# Patient Record
Sex: Female | Born: 1935 | Race: White | Hispanic: No
Health system: Midwestern US, Community
[De-identification: ages and names within clinical notes are randomized; demographics above are authoritative.]

## PROBLEM LIST (undated history)

## (undated) DIAGNOSIS — M810 Age-related osteoporosis without current pathological fracture: Secondary | ICD-10-CM

## (undated) DIAGNOSIS — IMO0002 Reserved for concepts with insufficient information to code with codable children: Secondary | ICD-10-CM

## (undated) DIAGNOSIS — F32A Depression, unspecified: Secondary | ICD-10-CM

## (undated) DIAGNOSIS — F329 Major depressive disorder, single episode, unspecified: Secondary | ICD-10-CM

## (undated) HISTORY — DX: Major depressive disorder, single episode, unspecified: F32.9

## (undated) HISTORY — PX: VAGINAL HYSTERECTOMY: SHX2639

## (undated) HISTORY — DX: Reserved for concepts with insufficient information to code with codable children: IMO0002

## (undated) HISTORY — DX: Age-related osteoporosis without current pathological fracture: M81.0

## (undated) HISTORY — DX: Depression, unspecified: F32.A

## (undated) HISTORY — PX: TONSILLECTOMY: SUR1361

---

## 2007-09-24 ENCOUNTER — Ambulatory Visit: Payer: Self-pay | Admitting: Internal Medicine

## 2008-03-31 ENCOUNTER — Encounter: Payer: Self-pay | Admitting: Family Medicine

## 2008-04-08 ENCOUNTER — Encounter: Payer: Self-pay | Admitting: Family Medicine

## 2008-04-24 ENCOUNTER — Emergency Department: Payer: Self-pay | Admitting: Emergency Medicine

## 2008-05-09 ENCOUNTER — Encounter: Payer: Self-pay | Admitting: Family Medicine

## 2009-03-24 ENCOUNTER — Ambulatory Visit: Payer: Self-pay | Admitting: Internal Medicine

## 2010-06-19 ENCOUNTER — Ambulatory Visit: Payer: Self-pay | Admitting: Internal Medicine

## 2010-10-09 ENCOUNTER — Ambulatory Visit: Payer: Self-pay | Admitting: Internal Medicine

## 2011-03-15 ENCOUNTER — Observation Stay: Payer: Self-pay | Admitting: Internal Medicine

## 2011-03-23 ENCOUNTER — Ambulatory Visit: Payer: Self-pay | Admitting: Internal Medicine

## 2011-05-01 ENCOUNTER — Other Ambulatory Visit: Payer: Self-pay | Admitting: Internal Medicine

## 2011-05-07 NOTE — Telephone Encounter (Signed)
Opened in error

## 2011-05-08 ENCOUNTER — Ambulatory Visit: Payer: Self-pay | Admitting: Internal Medicine

## 2011-07-26 ENCOUNTER — Ambulatory Visit: Payer: Self-pay | Admitting: Internal Medicine

## 2011-10-17 ENCOUNTER — Other Ambulatory Visit: Payer: Self-pay | Admitting: Gastroenterology

## 2011-12-31 ENCOUNTER — Ambulatory Visit: Payer: Self-pay | Admitting: Gastroenterology

## 2012-01-02 LAB — PATHOLOGY REPORT

## 2012-07-28 ENCOUNTER — Ambulatory Visit: Payer: Self-pay | Admitting: Internal Medicine

## 2012-11-07 ENCOUNTER — Ambulatory Visit: Payer: Self-pay | Admitting: Neurosurgery

## 2013-03-10 LAB — CBC
MCV: 94 fL (ref 80–100)
Platelet: 258 10*3/uL (ref 150–440)
RBC: 4.21 10*6/uL (ref 3.80–5.20)
RDW: 13.5 % (ref 11.5–14.5)

## 2013-03-10 LAB — MAGNESIUM: Magnesium: 2.1 mg/dL

## 2013-03-11 ENCOUNTER — Observation Stay: Payer: Self-pay | Admitting: Student

## 2013-03-11 LAB — URINALYSIS, COMPLETE
Bacteria: NONE SEEN
Blood: NEGATIVE
Glucose,UR: NEGATIVE mg/dL (ref 0–75)
Leukocyte Esterase: NEGATIVE
Nitrite: NEGATIVE
Ph: 6 (ref 4.5–8.0)
Protein: NEGATIVE
RBC,UR: NONE SEEN /HPF (ref 0–5)
Specific Gravity: 1.005 (ref 1.003–1.030)
WBC UR: 1 /HPF (ref 0–5)

## 2013-03-11 LAB — BASIC METABOLIC PANEL
Anion Gap: 6 — ABNORMAL LOW (ref 7–16)
BUN: 23 mg/dL — ABNORMAL HIGH (ref 7–18)
Chloride: 106 mmol/L (ref 98–107)
EGFR (African American): >60
EGFR (Non-African Amer.): >60
Glucose: 87 mg/dL (ref 65–99)
Osmolality: 279 (ref 275–301)
Sodium: 138 mmol/L (ref 136–145)

## 2013-03-11 LAB — TSH: Thyroid Stimulating Horm: 1.87 u[IU]/mL

## 2013-04-09 ENCOUNTER — Ambulatory Visit (INDEPENDENT_AMBULATORY_CARE_PROVIDER_SITE_OTHER): Payer: Medicare Other | Admitting: Podiatrist

## 2013-04-09 ENCOUNTER — Encounter: Payer: Self-pay | Admitting: Podiatrist

## 2013-04-09 VITALS — BP 126/47 | HR 65 | Resp 16 | Ht 62.0 in | Wt 194.2 lb

## 2013-04-09 DIAGNOSIS — B351 Tinea unguium: Secondary | ICD-10-CM | POA: Insufficient documentation

## 2013-04-09 NOTE — Progress Notes (Signed)
N TOENAIL TRIM  L BOTH FEET 1-5  D O C A T CANT TRIM SELF

## 2013-04-09 NOTE — Progress Notes (Signed)
MRN: [id number] Name: Samantha Reilly  Sex: female Age: 77 y.o. DOB: September 29, 1935  Provider: Marlowe Aschoff P  Allergies: Fosamax; Aleve; and Sulfa antibiotics   Chief Complaint  Patient presents with  . DEBRIDE TOENAILS    NEEDS TOENAILS TRIMMED B/L 1-5     HPI: Patient is 77 y.o. female who presents today for elongated, thickened toenails. 1-5 bilateral  Past Medical History  Diagnosis Date  . Osteoporosis   . Depression   . Bulging disc        Medication List       This list is accurate as of: 04/09/13 12:10 PM.  Always use your most recent med list.               ABILIFY 15 MG tablet  Generic drug:  ARIPiprazole  Take 7.5 mg by mouth daily.     calcium carbonate 200 MG capsule  Take 2 by mouth twice a day     Co Q 10 100 MG Caps  Take by mouth daily.     gabapentin 300 MG capsule  Commonly known as:  NEURONTIN  Take 300 mg by mouth at bedtime as needed.     lamoTRIgine 100 MG tablet  Commonly known as:  LAMICTAL  Take 200 mg by mouth daily.     levothyroxine 75 MCG tablet  Commonly known as:  SYNTHROID, LEVOTHROID  Take 75 mcg by mouth daily before breakfast.     NON FORMULARY  Flexacil Ultra 700 mg, take 3 tablets daily     OMEGA-3 FISH OIL PO  Take by mouth daily.     phenelzine 15 MG tablet  Commonly known as:  NARDIL  Take 15 mg by mouth 3 (three) times daily.     Potassium 99 MG Tabs  Take by mouth. Take one every am and 2 every pm     thiamine 100 MG tablet  Commonly known as:  VITAMIN B-1  Take 100 mg by mouth daily.     Vitamin D3 2000 UNITS Tabs  Take 2 capsules by mouth.        Meds ordered this encounter  Medications  . ARIPiprazole (ABILIFY) 15 MG tablet    Sig: Take 7.5 mg by mouth daily.  . phenelzine (NARDIL) 15 MG tablet    Sig: Take 15 mg by mouth 3 (three) times daily.  Marland Kitchen lamoTRIgine (LAMICTAL) 100 MG tablet    Sig: Take 200 mg by mouth daily.  Marland Kitchen levothyroxine (SYNTHROID, LEVOTHROID) 75 MCG tablet    Sig: Take  75 mcg by mouth daily before breakfast.  . DISCONTD: ibuprofen (ADVIL,MOTRIN) 200 MG tablet    Sig: Take 200 mg by mouth every 6 (six) hours as needed for pain.  Marland Kitchen gabapentin (NEURONTIN) 300 MG capsule    Sig: Take 300 mg by mouth at bedtime as needed.  . Potassium 99 MG TABS    Sig: Take by mouth. Take one every am and 2 every pm  . Cholecalciferol (VITAMIN D3) 2000 UNITS TABS    Sig: Take 2 capsules by mouth.  . Omega-3 Fatty Acids (OMEGA-3 FISH OIL PO)    Sig: Take by mouth daily.  . Coenzyme Q10 (CO Q 10) 100 MG CAPS    Sig: Take by mouth daily.  . calcium carbonate 200 MG capsule    Sig: Take 2 by mouth twice a day  . thiamine (VITAMIN B-1) 100 MG tablet    Sig: Take 100 mg by mouth daily.  Marland Kitchen NON  FORMULARY    Sig: Flexacil Ultra 700 mg, take 3 tablets daily    Past Surgical History  Procedure Laterality Date  . Vaginal hysterectomy    . Tonsillectomy       History  Substance Use Topics  . Smoking status: Never Smoker   . Smokeless tobacco: Never Used  . Alcohol Use: No    No family history on file.  Review of Systems  DATA OBTAINED: from patient GENERAL: Feels well no fevers, no fatigue, no changes in appetite SKIN: No itching, no rashes, no open lesions, no wounds EYES: No eye pain,no redness, no discharge EARS: No earache,no ringing of ears, no recent change in hearing NOSE: No congestion, no drainage, no bleeding  MOUTH/THROAT: No mouth pain, No sore throat, No difficulty chewing or swallowing  RESPIRATORY: No cough, no wheezing, no SOB CARDIAC: No chest pain,no heart palpitations,no new onset lower extremity edema  GI: No abdominal pain, No Nausea, no vomiting, no diarrhea, no heartburn or no reflux  GU: No dysuria, no increased frequency or urgency MUSCULOSKELETAL: No unrelieved bone/joint pain,  NEUROLOGIC: Awake, alert, appropriate to situation, No change in mental status. PSYCHIATRIC: No overt anxiety or sadness.No behavior issue.  AMBULATION:   Ambulates unassisted  Filed Vitals:   04/09/13 0942  BP: 126/47  Pulse: 65  Resp: 16    Physical Exam  GENERAL APPEARANCE: Alert, conversant. Appropriately groomed. No acute distress.  VASCULAR: Pedal pulses palpable and strong bilateral.  Capillary refill time is immediate to all digits,  Proximal to distal cooling it warm to warm.  Digital hair growth is present bilateral NEUROLOGIC: sensation is intact epicritically and protectively to 5.07 monofilament at 5/5 sites bilateral.  Light touch is intact bilateral, vibratory sensation intact bilateral, achilles tendon reflex is intact bilateral. MUSCULOSKELETAL: acceptable muscle strength, tone and stability bilateral.  Intrinsic muscluature intact bilateral.  Rectus appearance of foot and digits noted bilateral. DERMATOLOGIC: skin color, texture, and turger are within normal limits.  No preulcerative lesions are seen, no interdigital maceration noted.  No open lesions present.  Digital nails are elongated and thickened and asymptomatic. Patient relates nails are elongated but not painful   Patient Active Problem List   Diagnosis Date Noted  . Onychomycosis of toenail 04/09/2013    Assessment   Asymptomatic onychomycosis  Plan  No orders of the defined types were placed in this encounter.   Discussed treatment options and alternatives.  Counseled patient on nail care and discussed she is not eligible for covered service due to no pain and no class findings noted.  Patient demonstrates understanding of conversation and will be seen back per her request    Davy Pique MRN: [id number] Name: Samantha Reilly  Sex: female Age: 77 y.o. DOB: 10/16/1935

## 2013-04-09 NOTE — Patient Instructions (Signed)
Call if any problems or concerns arise in the future

## 2013-07-29 ENCOUNTER — Ambulatory Visit: Payer: Self-pay | Admitting: Internal Medicine

## 2013-08-21 IMAGING — CR DG KNEE COMPLETE 4+V*R*
1 series · 4 of 4 positions shown · non-contrast
Comparison: none

REASON FOR EXAM: knee pain, s/p fall
COMMENTS:

[Series 1: view not recorded · 0.17mm/px · 4 of 4 slices shown]
[im 1/4]
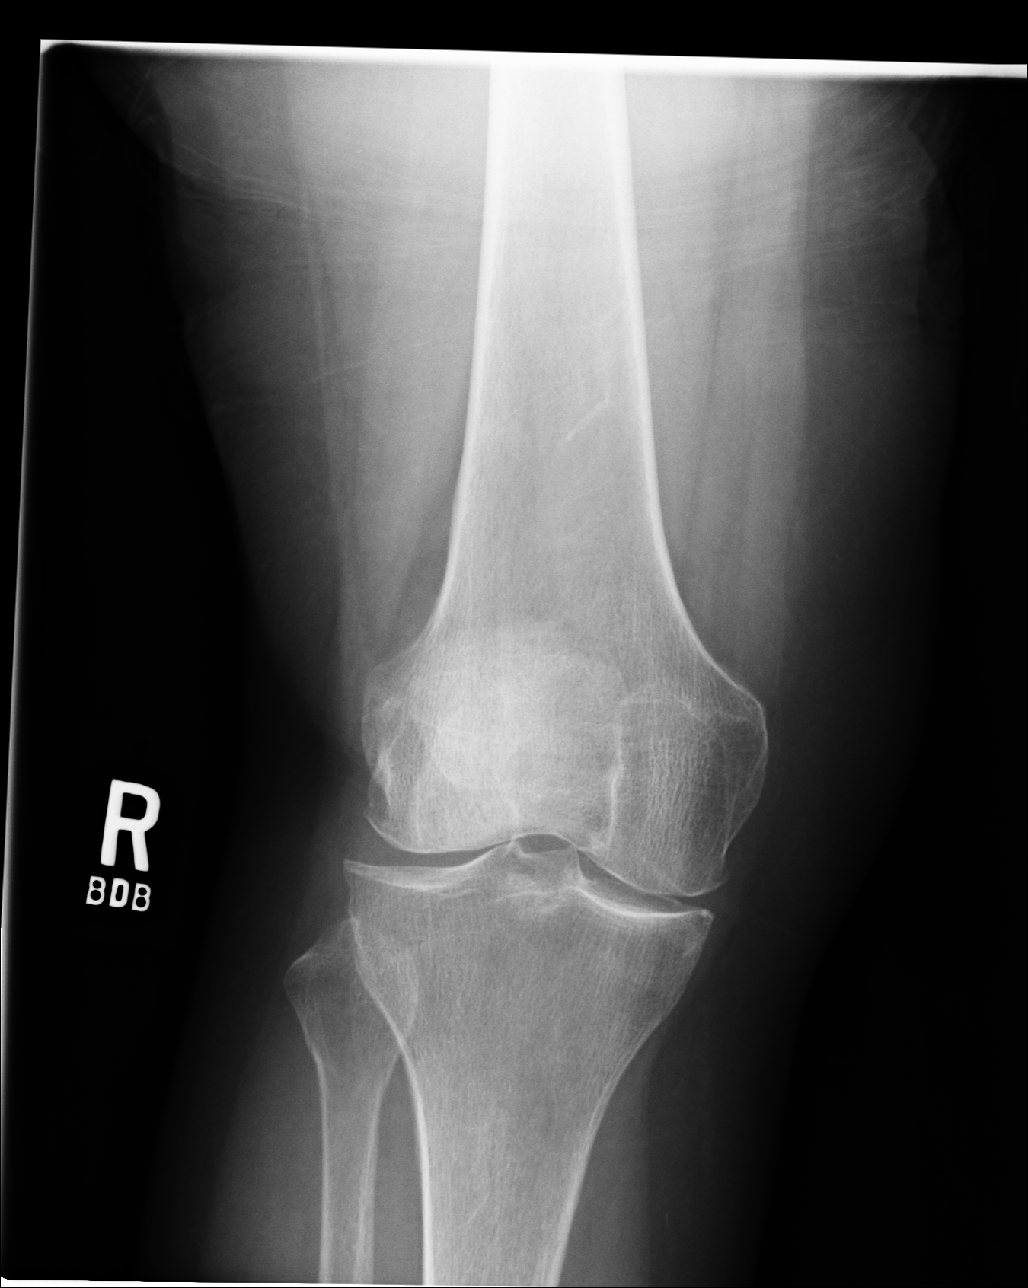
[im 2/4]
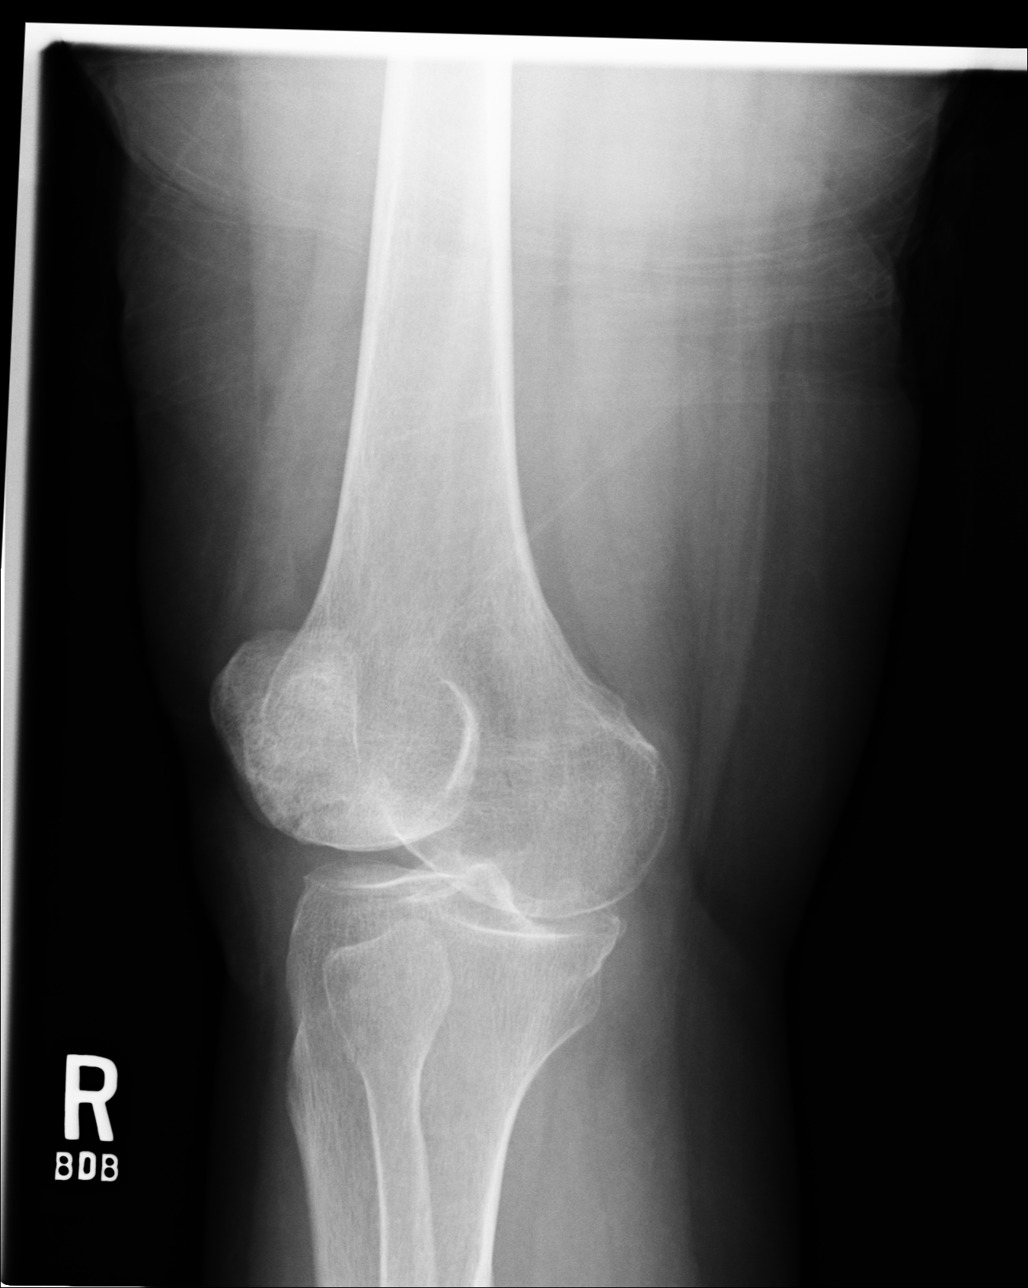
[im 3/4]
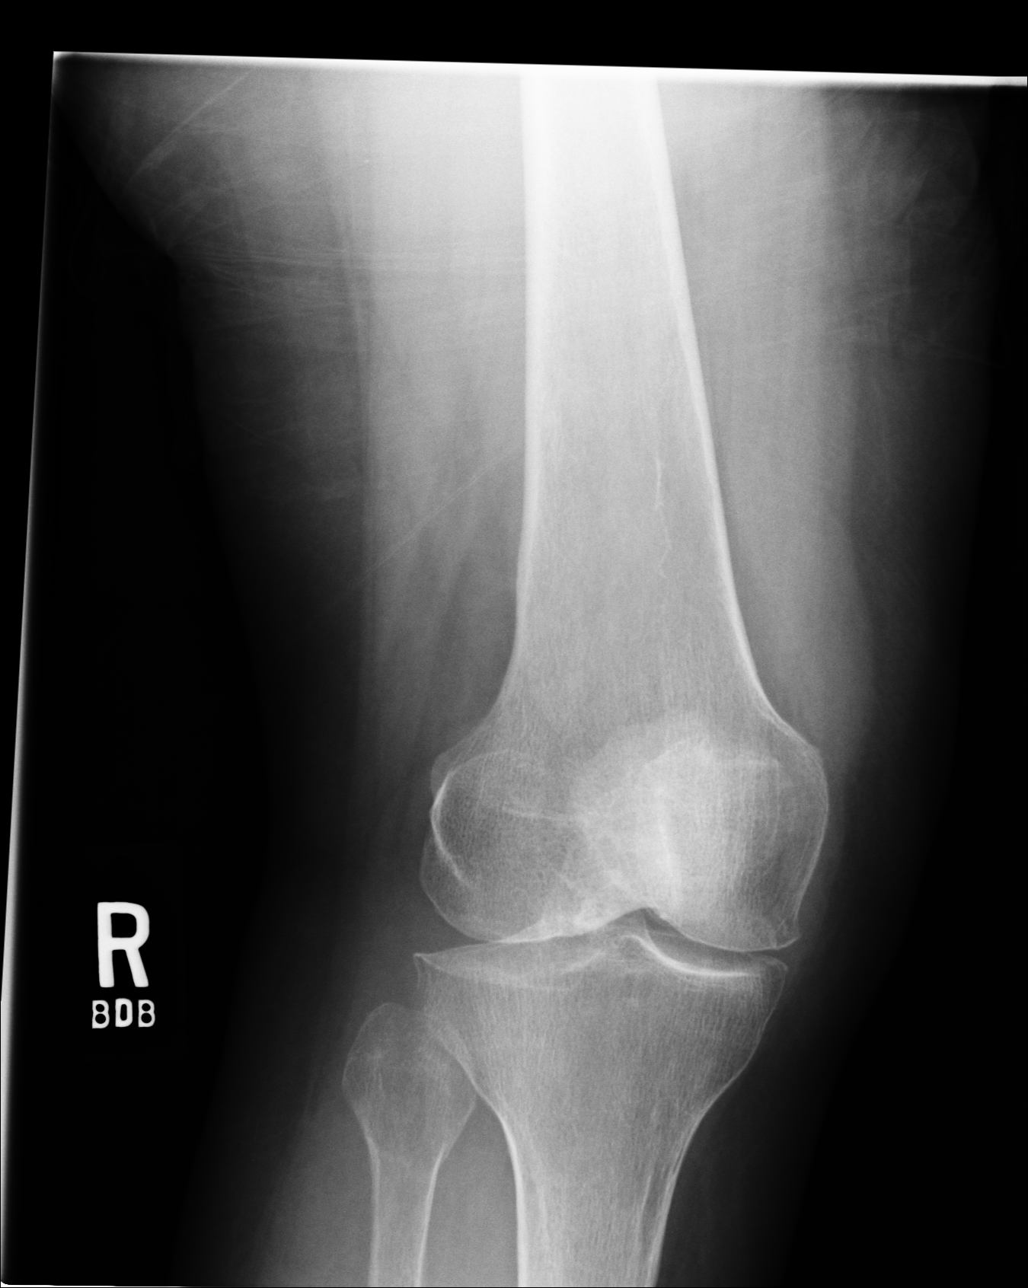
[im 4/4]
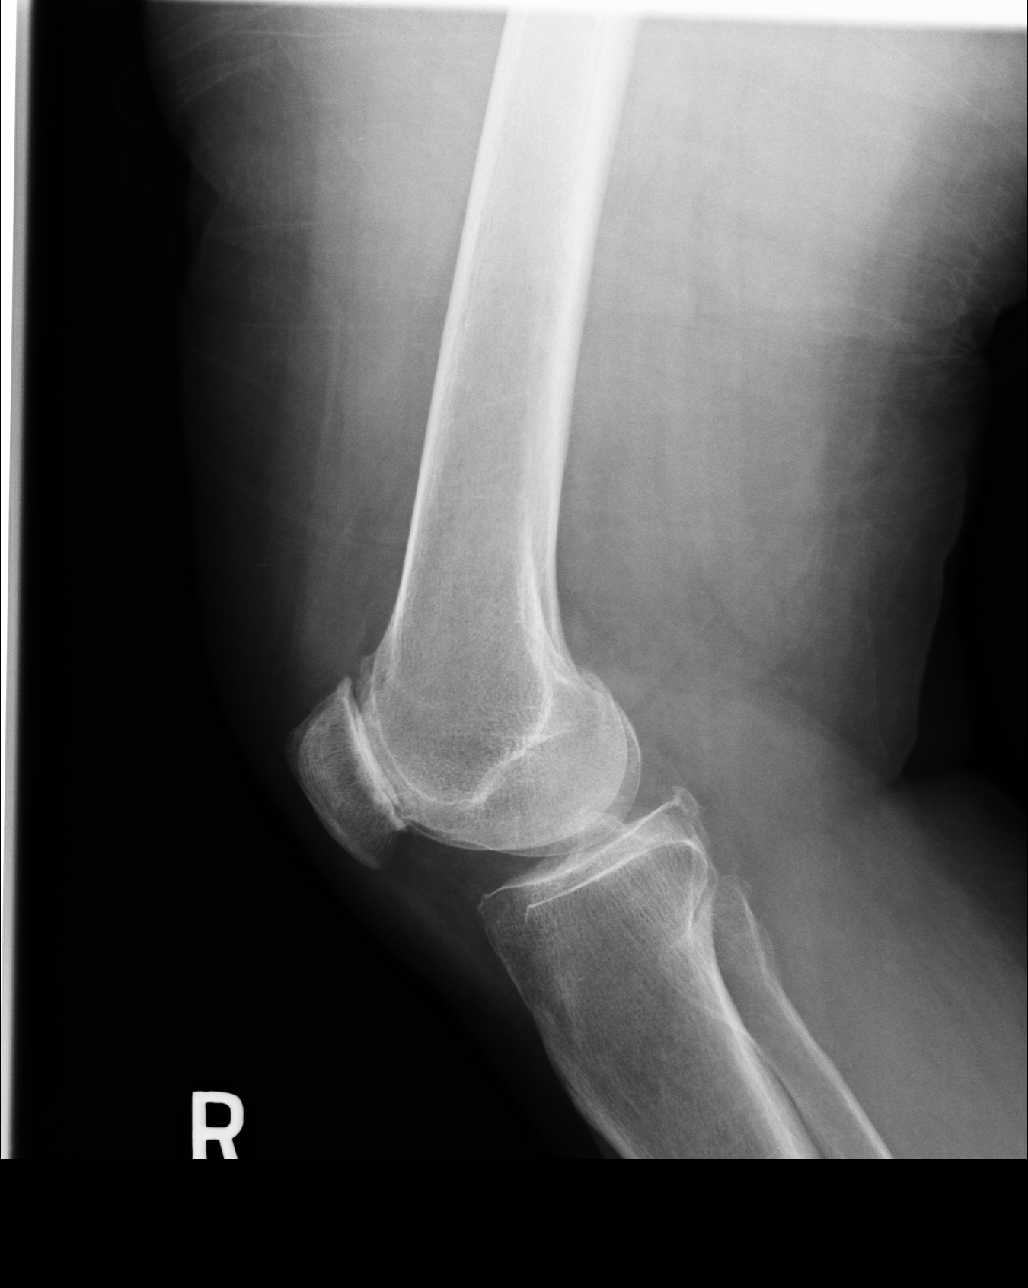

[4 of 4 positions shown; findings below may reference images not displayed]

PROCEDURE:     DXR - DXR KNEE RT COMP WITH OBLIQUES  - March 23, 2011  [DATE]

RESULT:     No fracture, dislocation or other acute bony abnormality is
identified. There is very slight spur formation at the knee medially and
laterally. Additionally, there is slight dorsal patella spurring and
narrowing of the femoral patella joint space compatible with arthritic
change. No lytic or blastic lesions about the knee are seen.
IMPRESSION: 1. No fracture or other acute bony abnormality is seen.
2. There is slight arthritic spurring about the knee.
3. Arthritic change is noted at the femoropatellar joint.

## 2013-12-21 ENCOUNTER — Ambulatory Visit: Payer: Self-pay | Admitting: Pain Medicine

## 2013-12-24 ENCOUNTER — Other Ambulatory Visit: Payer: Self-pay | Admitting: Pain Medicine

## 2013-12-24 LAB — HEPATIC FUNCTION PANEL A (ARMC)
ALBUMIN: 3 g/dL — AB (ref 3.4–5.0)
Alkaline Phosphatase: 121 U/L — ABNORMAL HIGH
Bilirubin, Direct: <0.1 mg/dL (ref 0.00–0.20)
Bilirubin,Total: 0.3 mg/dL (ref 0.2–1.0)
SGOT(AST): 12 U/L — ABNORMAL LOW (ref 15–37)
SGPT (ALT): 24 U/L (ref 12–78)
TOTAL PROTEIN: 6.2 g/dL — AB (ref 6.4–8.2)

## 2013-12-24 LAB — BASIC METABOLIC PANEL
ANION GAP: 6 — AB (ref 7–16)
BUN: 25 mg/dL — AB (ref 7–18)
CALCIUM: 8.8 mg/dL (ref 8.5–10.1)
CO2: 26 mmol/L (ref 21–32)
Chloride: 102 mmol/L (ref 98–107)
Creatinine: 1.18 mg/dL (ref 0.60–1.30)
EGFR (African American): 52 — ABNORMAL LOW
GFR CALC NON AF AMER: 45 — AB
GLUCOSE: 105 mg/dL — AB (ref 65–99)
OSMOLALITY: 273 (ref 275–301)
POTASSIUM: 4.4 mmol/L (ref 3.5–5.1)
SODIUM: 134 mmol/L — AB (ref 136–145)

## 2013-12-24 LAB — MAGNESIUM: Magnesium: 2.2 mg/dL

## 2013-12-24 LAB — SEDIMENTATION RATE: ERYTHROCYTE SED RATE: 31 mm/h — AB (ref 0–30)

## 2014-01-20 ENCOUNTER — Ambulatory Visit: Payer: Self-pay | Admitting: Pain Medicine

## 2014-01-21 ENCOUNTER — Ambulatory Visit: Payer: Self-pay | Admitting: Pain Medicine

## 2014-02-04 ENCOUNTER — Ambulatory Visit: Payer: Self-pay | Admitting: Internal Medicine

## 2014-02-09 ENCOUNTER — Ambulatory Visit: Payer: Self-pay | Admitting: Pain Medicine

## 2014-02-24 ENCOUNTER — Ambulatory Visit: Payer: Self-pay | Admitting: Pain Medicine

## 2014-03-03 ENCOUNTER — Ambulatory Visit: Payer: Self-pay | Admitting: Neurology

## 2014-03-30 ENCOUNTER — Ambulatory Visit: Payer: Self-pay | Admitting: Pain Medicine

## 2014-04-06 ENCOUNTER — Ambulatory Visit: Payer: Self-pay | Admitting: Pain Medicine

## 2014-04-21 ENCOUNTER — Ambulatory Visit: Payer: Self-pay | Admitting: Pain Medicine

## 2014-05-04 ENCOUNTER — Ambulatory Visit: Payer: Self-pay | Admitting: Pain Medicine

## 2014-05-18 ENCOUNTER — Ambulatory Visit: Payer: Self-pay | Admitting: Pain Medicine

## 2014-10-29 NOTE — H&P (Signed)
PATIENT NAME:  Samantha Reilly, Samantha Reilly MR#:  [id number] DATE OF BIRTH:  12/26/1935  DATE OF ADMISSION:  03/11/2013  REFERRING PHYSICIAN:  Brenton Grills, MD    PRIMARY CARE PHYSICIAN:  Gwenette Greet A. Heber [place] Weber, MD  CHIEF COMPLAINT:  Weakness, unsteady gait.   HISTORY OF PRESENT ILLNESS: This is a 79 year old female with significant past medical history of hypothyroidism, osteoporosis, depression, hypertension, who presents with complaints of difficulty walking and unsteady gait for the last 24 hours. She denies any focal deficits, any tingling, any numbness, any paresthesia. Patient had a CT head without contrast in ED, which did not show any acute changes, only stable findings from previous CT, which was showing atrophy and chronic microvascular ischemic disease. The patient had basic blood work-up done,  which came back significant for hypocalcemia at 5. As well, her last values were reviewed at [facility] They were 9.6 in April 2013. The patient's magnesium level was at 2.1. The patient denies any tetany, any muscle spasms, as well she denies any chest pain, shortness of breath, fever, chills, vomiting, nausea. Hospitalist service was requested to admit the patient for further management and work up of her hypocalcemia. The patient's EKG did not show any significant ST or T wave changes and no QT prolongation.   PAST MEDICAL HISTORY:  1.  Allergic rhinitis.  2.  Hypothyroidism.  3.  Osteoporosis.  4.  Osteoarthritis.  5.  Depression.  6.  History of hypertension.  7.  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   ALLERGIES: MORPHINE AND SULFA.   HOME MEDICATIONS:  1.  Gabapentin 300 mg oral at bedtime.  2.  Lamictal 100 mg oral daily.  3.  Phenelzine 15 mg oral 3 times a day.  4.  Abilify 5 mg oral 1-1/2 tablets daily.  5.  Potassium chloride 99 mg 1 tablet in the morning, 2 tablets at bedtime.  6.  Synthroid 75 mcg oral daily.  7.  Balanced B-100 oral tablet  daily.  8.  Vitamin D3 2000 international units 2 capsules daily.  9.  Calcium, magnesium and zinc tablet, 2 tablets 2 times a day.  10.  Omega fish oil 600/240 one capsule twice a day.  11.  CoQ10 100 mg plus vitamin E capsule 2 capsules daily.   SOCIAL HISTORY: Nonsmoker. No alcohol. No drug use. Lives at home with her husband.   FAMILY HISTORY: Significant for coronary artery disease in her brother.   REVIEW OF SYSTEMS:  CONSTITUTIONAL: The patient denies fever, chills. Complains of generalized weakness. Denies weight gain, weight loss.  EYES: Denies blurry vision, double vision, inflammation, glaucoma.  ENT: Denies tinnitus, ear pain, hearing loss, epistaxis or discharge.  RESPIRATORY: Denies cough, wheezing, hemoptysis, dyspnea or COPD. CARDIOVASCULAR: Denies chest pain, orthopnea, edema, arrhythmia or palpitations.  GASTROINTESTINAL: Denies nausea, vomiting, diarrhea, abdominal pain, hematemesis, melena, jaundice, rectal bleed.  GENITOURINARY: Denies dysuria, hematuria, renal colic.  ENDOCRINE: Denies polyuria, polydipsia, heat or cold intolerance. Has history of hypothyroidism. Denies any history of thyroid surgery or parathyroid surgery.  INTEGUMENTARY: Denies acne, rash or skin lesions.  MUSCULOSKELETAL: Denies any gout, cramps. Has history of arthritis. Denies any twitching of the muscles or muscle spasm.  NEUROLOGIC: Denies CVA, TIA, seizures, memory loss, dementia or focal weakness or numbness. Complains of unsteady gait and generalized weakness.  PSYCHIATRIC: Denies any anxiety, insomnia, substance abuse, alcohol abuse. Has history of depression in the past, which required ECT therapy.   PHYSICAL EXAMINATION:  VITAL SIGNS:  Temperature 98.1, pulse 53, respiratory rate 18, blood pressure 135/55, saturating 98% on room air.  GENERAL: Frail, elderly female who looks comfortable and in no apparent distress.  HEENT: Head is atraumatic, normocephalic. Pupils equal, reactive to light.  Pink conjunctivae. Anicteric sclerae. Moist oral mucosa.  NECK: Supple. No thyromegaly. No JVD.  CHEST: Good air entry bilaterally. No wheezing, rales or rhonchi.  CARDIOVASCULAR: S1, S2 heard. No rubs, murmur or gallops.  ABDOMEN: Soft, nontender, nondistended. Bowel sounds present.  EXTREMITIES: No edema. No clubbing. No cyanosis. Dorsalis pedis pulse +2 bilaterally.  SKIN: Normal skin turgor. Warm and dry.  PSYCHIATRIC: Appropriate affect. Awake, alert x3. Intact judgment and insight.  NEUROLOGIC: Cranial nerves grossly intact. Motor 5/5. No increased tonicity. No hyperreflexia. Negative Chvostek sign.   PERTINENT LABORATORIES AND DATA: Glucose 87, BUN 26, creatinine less than 0.1, sodium 135, potassium 4.5, chloride 106, CO2 20. Anion gap 9. Osmolality 274. Troponin is 0.02. ALT 30, AST 23, alk  phos 139, albumin serum 3.2, total protein 6.9, total bilirubin 0.2. Troponin less than 0.02. White blood cells 8, hemoglobin 13.2, hematocrit 39.7, platelets 257.   IMAGING: CT head without contrast showing stable CT of brain with changes of atrophy and chronic microvascular ischemic disease. No acute intracranial abnormality evident.   ASSESSMENT AND PLAN:  1.  Hypocalcemia. The patient's last value in April 2013 was 9.6. Appears to be significant drop within 1 year. The patient denies any history of neck surgery or any recent diagnosis of calcium disorder, but she reports she did cut her p.o. calcium supplement due to financial reasons over the last couple of months, but doubt this will cause this significant hypocalcemia. The patient was given calcium gluconate IV, a total of 2 grams in the ED. As well, given 2 grams of magnesium in the ED as well. Her level was normal, magnesium at 2.1. The patient does not appear to have any arrhythmias or any QT prolongation. We will check vitamin D, PTH, PTH-related peptide and TSH. I will consult endocrinology service. We will continue to monitor her on tele. We  will recheck another set of labs and, if her hypocalcemia is not improved significantly with the supplements in the ED, she will need to be started on calcium drip. Meanwhile, we will start her on p.o. calcium as well.  2.  Generalized weakness and unsteady gait. The patient's CT did not show an acute findings. Her physical exam has no focal deficits. Has no acute findings. This is most likely related to  hypocalcemia. We will have PT consulted.  3.  Hypothyroidism. We will check TSH, continue with Synthroid.  4.  Osteoporosis. Continue with calcium and vitamin D.  5.  Depression. Continue with her home meds.  6.  Hypertension. Blood pressure is acceptable. Continue her on hydrochlorothiazide.  7.  Deep vein thrombosis prophylaxis. Subcu heparin.  8.  CODE STATUS: The patient is FULL CODE. Reports she has a LIVING WILL and her husband is her healthcare power of attorney.   TOTAL TIME SPENT ON ADMISSION AND PATIENT CARE: 55 minutes.  ____________________________ Albertine Patricia, MD dse:sw D: 03/11/2013 01:19:00 ET T: 03/11/2013 02:04:51 ET JOB#: [id number]  cc: Albertine Patricia, MD, <Dictator> DAWOOD Graciela Husbands MD ELECTRONICALLY SIGNED 03/20/2013 1:08

## 2014-10-29 NOTE — Discharge Summary (Signed)
PATIENT NAME:  Samantha Reilly MR#:  [id number] DATE OF BIRTH:  1936/05/10  DATE OF ADMISSION:  03/11/2013 DATE OF DISCHARGE:  03/11/2013  PRIMARY CARE PHYSICIAN: Samantha P. Alison MurrayAndreassi, MD  CHIEF COMPLAINT: Weakness, unsteady gait.   DISCHARGE DIAGNOSES 1.  Weakness and dizziness, resolved, possibly chronic.  2.  Degenerative disk disease and scoliosis.  3.  History of osteoporosis.  4.  Hypothyroidism.  5.  Depression.  6.  Hypertension.   DISCHARGE MEDICATIONS 1.  Synthroid 75 mcg daily.  2.  Phenelzine 15 mg 3 times a day.  3.  Lamotrigine 200 mg once a day.  4.  Abilify 7.5 mg once a day.  5.  Balanced B 100 oral tab, 1 tab daily.  6.  Vitamin D3 2000 international units 2 capsules once a day. 7.  Potassium chloride  oral tablet, 1 tab once a day in the morning and 2 tabs bedtime.  8.  Co-Q enzyme 10, 100 mg 2 caps once a day.  9.  Omega fish oil one 1 one cap once a day.  10.  Gabapentin 300 mg at bedtime as needed.   DIET: Low sodium.   ACTIVITY: As tolerated. Outpatient PT referral.   FOLLOWUP: Please follow with your primary care physician. She will be getting discharged with a walker.   SIGNIFICANT LABS AND IMAGING 1.  Initial BUN 23, creatinine 0.86, sodium 138, potassium 4.5, chloride was 106. Initial sets of labs was noted to be error and labs which I just dictated were corrected.  2.  Calcium initially was 10.3, magnesium was 2.1. TSH was 1.87. CBC within normal limits. Urinalysis not suggestive of infection.  3.  CAT scan of the head without contrast showing stable CT of the brain with changes of atrophy and chronic microvascular ischemic changes.   HOSPITAL COURSE: For full details of H and P, please see the dictation by Dr. Randol KernElgergawy from earlier today but, briefly, this is a 79 year old female with hypothyroidism, osteoporosis, depression, hypertension, who came in for difficulty walking and unsteady gait. She was noted to have initially with a low calcium in  the 5 range and admitted to the hospitalist service. However, the first set of labs was all erroneous and the labs were redrawn and the calcium came back at 10 after the patient did receive some IV calcium. She underwent a CAT scan of the head which was negative for acute stroke and she was neurologically intact. Although initially she was admitted for hypocalcemia, I believe the dizziness and unsteady gait is more chronic in nature as her husband described walking slowly with a hunched-over neck and has degenerative disk disease, osteoporosis and he stated that he had felt that the patient needed a walker for a long time. She was seen by PT and even prior to her being seen by PT I got paged multiple times as the patient and her husband wanted to go home. I discussed the case with physical therapist who stated that she did well with PT and recommendation was outpatient PT and a walker. I have put a prescription for a walker. As the patient and her husband are very adamant about getting discharged, I will go ahead and discharge her as there is no neurological findings.   PHYSICAL EXAMINATION VITAL SIGNS:  On the day of discharge, her temperature is 98.1, pulse rate 66, respiratory rate  18, blood pressure 106/55, O2 sat 92% on room air.  GENERAL: The patient is an obese female laying in bed  in no obvious distress.  HEENT: Normocephalic, atraumatic. Moist mucous membranes.  NECK: Supple.  LUNGS: Clear to auscultation without wheezing, rhonchi or rales.  HEART:  Auscultation of the heart sounds shows a regular rate without significant murmurs.  ABDOMEN: Morbidly obese. No tenderness.  EXTREMITIES: No significant lower extremity edema.   At this point, she will be getting discharged.   TOTAL TIME SPENT: 35 minutes.   Of note, the patient states that her PCP has moved and she has established care with [facility] but her appointment is in October. I did state that if she has any other symptoms or  worsening of her symptoms or any other issues, to go to the [facility] walk-in urgent care center and if her symptoms persist or come back she can also come to the Emergency Room, but given how adamant she is about going home, her husband stated that she would likely go to the urgent care center.   ____________________________ Krystal Eaton, MD sa:cs D: 03/11/2013 16:59:00 ET T: 03/11/2013 20:35:00 ET JOB#: [id number]  cc: Krystal Eaton, MD, <Dictator> Krystal Eaton MD ELECTRONICALLY SIGNED 03/31/2013 13:51

## 2016-08-01 IMAGING — MR MRI HEAD WITHOUT AND WITH CONTRAST
12 series · 42 of 48 positions shown · IV contrast (multihance)
Comparison: 03/10/2013

CLINICAL DATA: Followup atrophy.  Memory loss.  Recent fall.

EXAM:
MRI HEAD WITHOUT AND WITH CONTRAST
TECHNIQUE: Multiplanar, multiecho pulse sequences of the brain and surrounding
structures were obtained without and with intravenous contrast.
CONTRAST:  9 cc MultiHance

[Series 2: T1 · sagittal · 5.0mm · 0.45mm/px · 3 of 27 slices shown (1 of 2)]
[im 1/27]
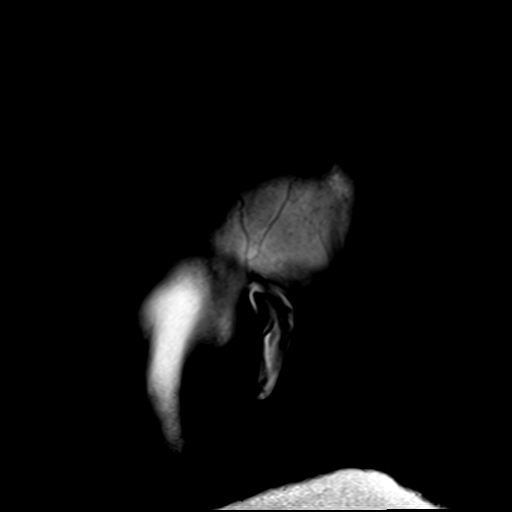
[im 14/27]
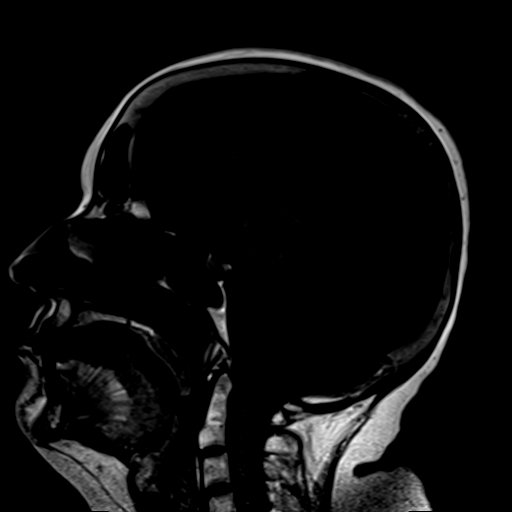
[im 27/27]
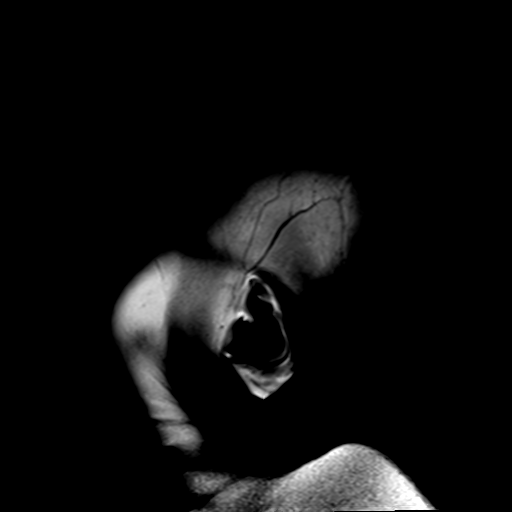

[Series 4: DWI · axial · 5.0mm · 1.80mm/px · z∈[-87,+70]mm · 3 of 26 slices shown (1 of 4)]
[im 1/26]
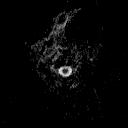
[im 13/26]
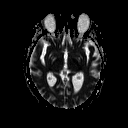
[im 26/26]
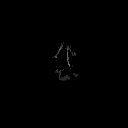

[Series 6: DWI · coronal · 5.0mm · 1.80mm/px · 4 of 37 slices shown (2 of 4)]
[im 1/37]
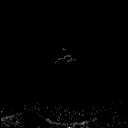
[im 13/37]
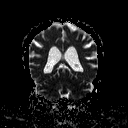
[im 25/37]
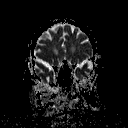
[im 37/37]
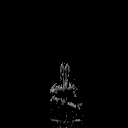

[Series 7: T2 · axial · 5.0mm · 0.60mm/px · z∈[-87,+76]mm · 3 of 27 slices shown (1 of 2)]
[im 1/27]
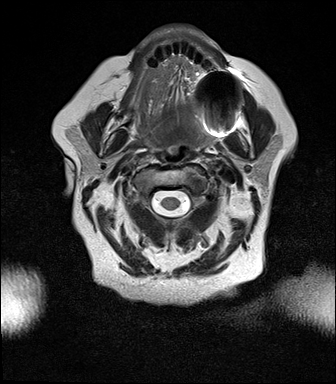
[im 14/27]
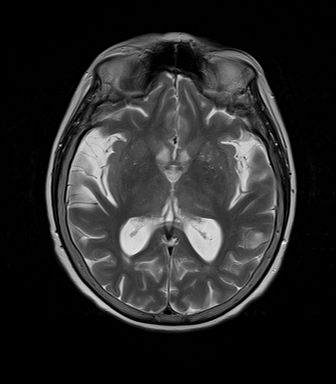
[im 27/27]
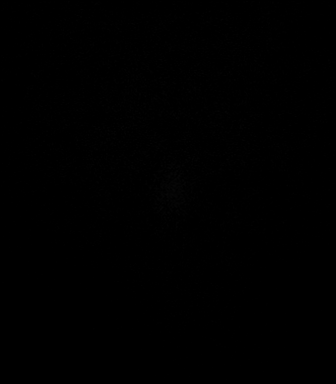

[Series 8: FLAIR · axial · 5.0mm · 0.45mm/px · z∈[-87,+76]mm · 3 of 27 slices shown]
[im 1/27]
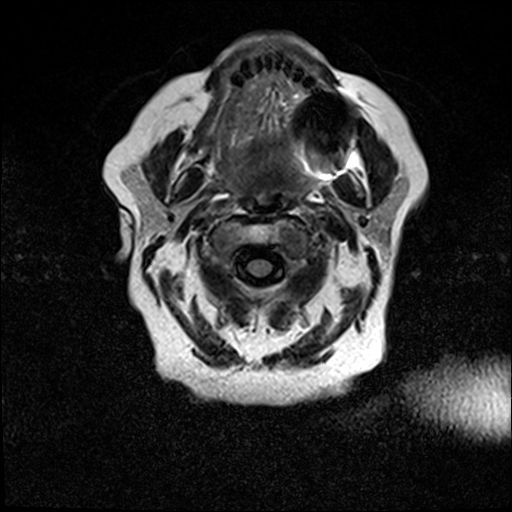
[im 14/27]
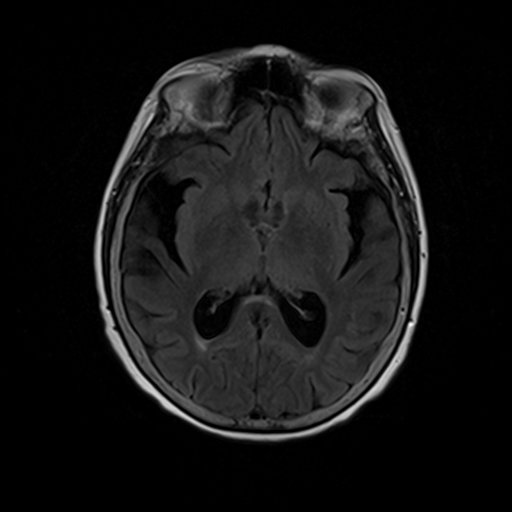
[im 27/27]
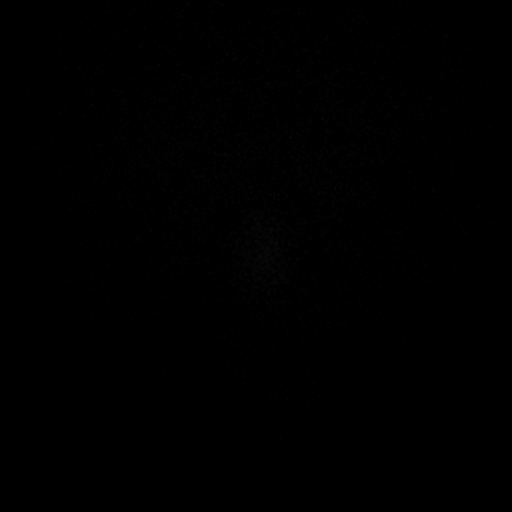

[Series 9: T2 · axial · 5.0mm · 0.45mm/px · z∈[-87,+76]mm · 3 of 27 slices shown (2 of 2)]
[im 1/27]
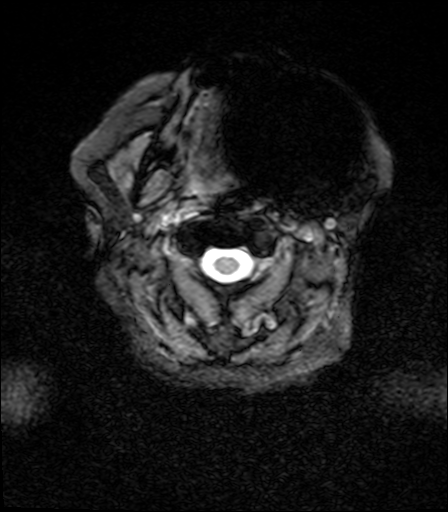
[im 14/27]
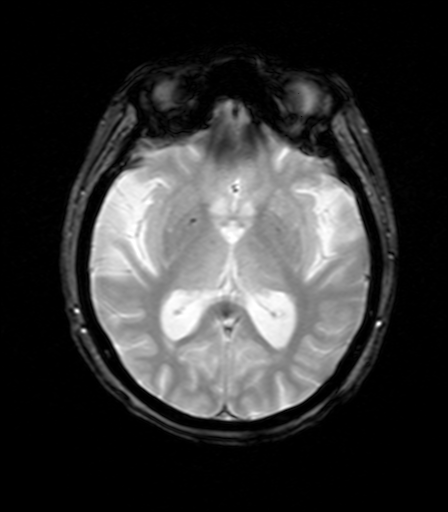
[im 27/27]
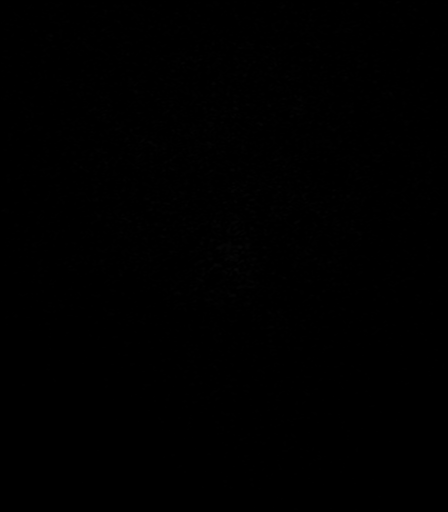

[Series 10: T1 · axial · 3.0mm · 1.00mm/px · 1 of 64 slices shown (2 of 2)]
[im 1/64]
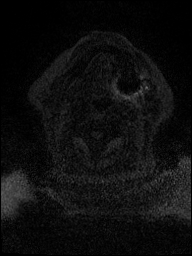

[Series 11: T2 post-contrast · coronal · 5.0mm · 0.49mm/px · 4 of 37 slices shown]
[im 1/37]
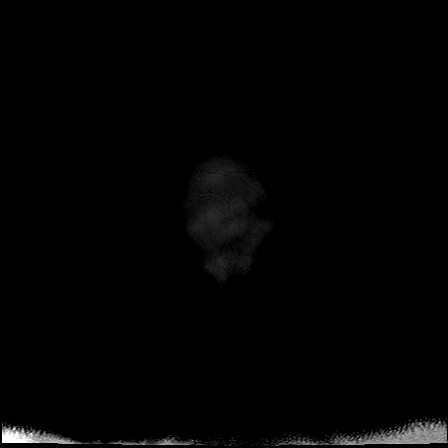
[im 13/37]
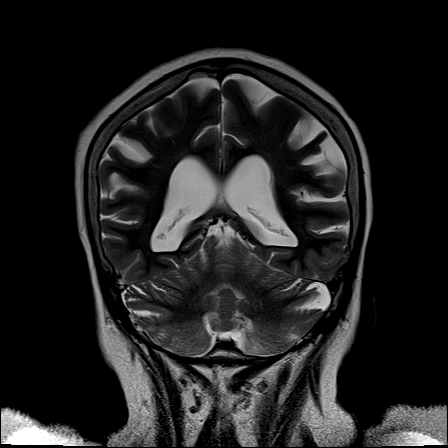
[im 25/37]
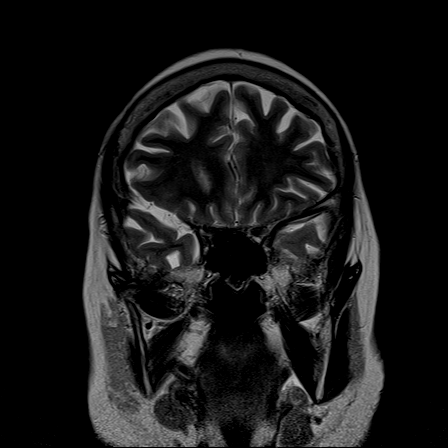
[im 37/37]
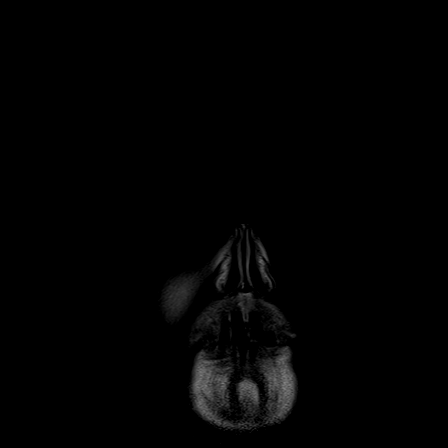

[Series 12: T1 post-contrast · axial · 3.0mm · 1.00mm/px · z∈[-93,+89]mm · 7 of 64 slices shown (1 of 2)]
[im 1/64]
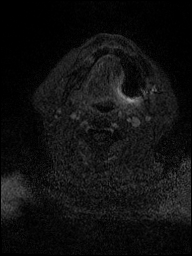
[im 11/64]
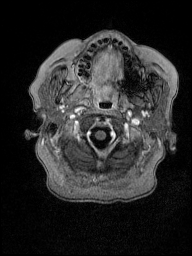
[im 22/64]
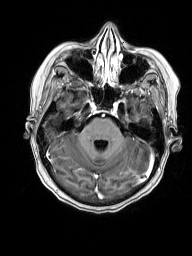
[im 32/64]
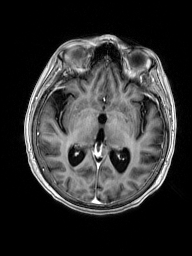
[im 43/64]
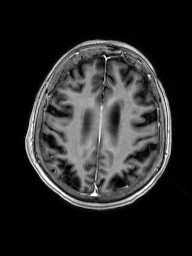
[im 53/64]
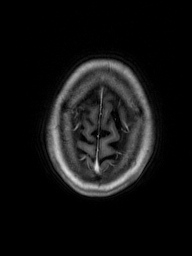
[im 64/64]
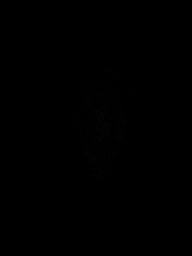

[Series 13: T1 post-contrast · coronal · 5.0mm · 0.43mm/px · 4 of 37 slices shown (2 of 2)]
[im 1/37]
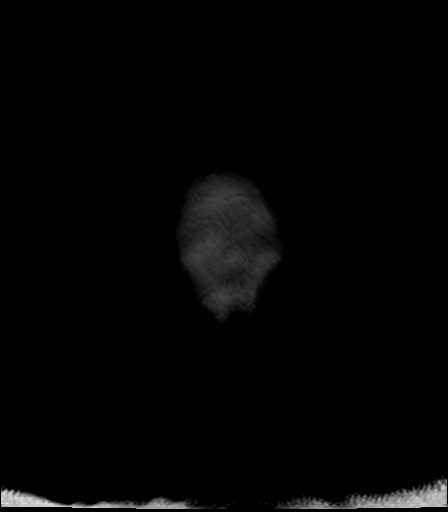
[im 13/37]
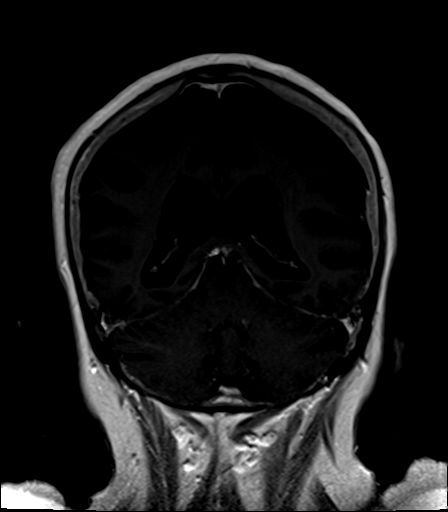
[im 25/37]
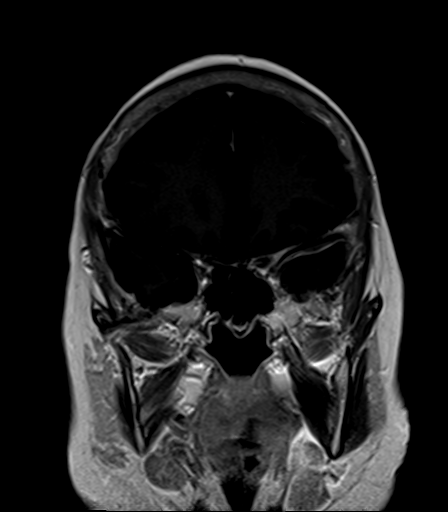
[im 37/37]
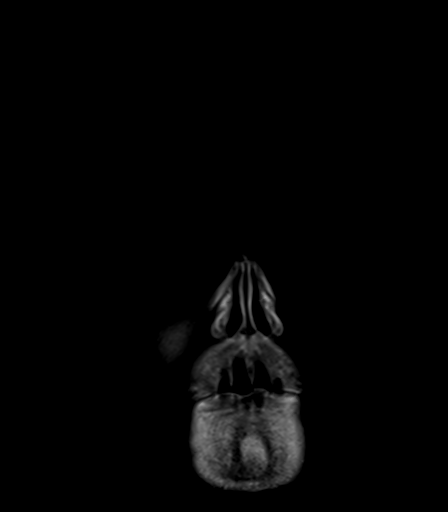

[Series 100: DWI · axial · 5.0mm · 1.80mm/px · z∈[-87,+76]mm · 3 of 27 slices shown (3 of 4)]
[im 1/27]
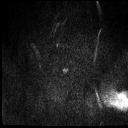
[im 14/27]
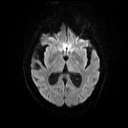
[im 27/27]
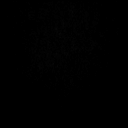

[Series 101: DWI · coronal · 5.0mm · 1.80mm/px · 4 of 37 slices shown (4 of 4)]
[im 1/37]
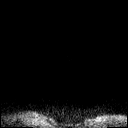
[im 13/37]
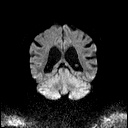
[im 25/37]
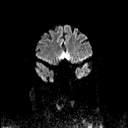
[im 37/37]
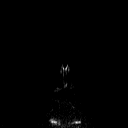

[42 of 48 positions shown; findings below may reference images not displayed]

FINDINGS: Diffusion imaging does not show any acute or subacute infarction.
There are mild chronic small-vessel changes of the pons. No focal
cerebellar insult. The cerebral hemispheres show mild chronic
small-vessel change of the deep and subcortical white matter. There
is moderate generalized atrophy. No cortical or large vessel
territory infarction. No mass lesion, hemorrhage, hydrocephalus or
extra-axial collection. After contrast administration, no abnormal
enhancement occurs. No pituitary mass. No inflammatory sinus
disease. No skull or skullbase lesion.
IMPRESSION: No acute or reversible finding. Moderate generalized atrophy. Mild
chronic small-vessel change of the cerebral hemispheric white matter
and the pons.

## 2018-06-19 ENCOUNTER — Emergency Department: Admit: 2018-06-19 | Payer: MEDICARE | Primary: Family Medicine

## 2018-06-19 ENCOUNTER — Inpatient Hospital Stay
Admit: 2018-06-19 | Discharge: 2018-06-20 | Disposition: A | Discharge status: Home / Self Care | Payer: MEDICARE | Attending: Emergency Medicine

## 2018-06-19 DIAGNOSIS — K819 Cholecystitis, unspecified: Secondary | ICD-10-CM

## 2018-06-19 LAB — CBC WITH AUTO DIFFERENTIAL
Basophils %: 1 % (ref 0–1)
Basophils Absolute: 0 10*3/uL (ref 0.0–0.1)
Eosinophils %: 4 % (ref 0–7)
Eosinophils Absolute: 0.3 10*3/uL (ref 0.0–0.4)
Granulocyte Absolute Count: 0 10*3/uL (ref 0.00–0.04)
Hematocrit: 28.8 % — ABNORMAL LOW (ref 35.0–47.0)
Hemoglobin: 9.6 g/dL — ABNORMAL LOW (ref 11.5–16.0)
Immature Granulocytes: 0 % (ref 0.0–0.5)
Lymphocytes %: 20 % (ref 12–49)
Lymphocytes Absolute: 1.6 10*3/uL (ref 0.8–3.5)
MCH: 31.8 PG (ref 26.0–34.0)
MCHC: 33.3 g/dL (ref 30.0–36.5)
MCV: 95.4 FL (ref 80.0–99.0)
MPV: 9.6 FL (ref 8.9–12.9)
Monocytes %: 10 % (ref 5–13)
Monocytes Absolute: 0.8 10*3/uL (ref 0.0–1.0)
NRBC Absolute: 0 10*3/uL (ref 0.00–0.01)
Neutrophils %: 65 % (ref 32–75)
Neutrophils Absolute: 5.3 10*3/uL (ref 1.8–8.0)
Nucleated RBCs: 0 PER 100 WBC
Platelets: 317 10*3/uL (ref 150–400)
RBC: 3.02 M/uL — ABNORMAL LOW (ref 3.80–5.20)
RDW: 13.8 % (ref 11.5–14.5)
WBC: 8.1 10*3/uL (ref 3.6–11.0)

## 2018-06-19 LAB — COMPREHENSIVE METABOLIC PANEL
ALT: 19 U/L (ref 12–78)
AST: 9 U/L — ABNORMAL LOW (ref 15–37)
Albumin/Globulin Ratio: 0.9 — ABNORMAL LOW (ref 1.1–2.2)
Albumin: 3.2 g/dL — ABNORMAL LOW (ref 3.5–5.0)
Alkaline Phosphatase: 116 U/L (ref 45–117)
Anion Gap: 11 mmol/L (ref 5–15)
BUN: 10 MG/DL (ref 6–20)
Bun/Cre Ratio: 10 — ABNORMAL LOW (ref 12–20)
CO2: 25 mmol/L (ref 21–32)
Calcium: 9 MG/DL (ref 8.5–10.1)
Chloride: 92 mmol/L — ABNORMAL LOW (ref 97–108)
Creatinine: 1.02 MG/DL (ref 0.55–1.02)
EGFR IF NonAfrican American: 52 mL/min/{1.73_m2} — ABNORMAL LOW (ref >60)
GFR African American: >60 mL/min/{1.73_m2} (ref >60)
Globulin: 3.6 g/dL (ref 2.0–4.0)
Glucose: 88 mg/dL (ref 65–100)
Potassium: 4.3 mmol/L (ref 3.5–5.1)
Sodium: 128 mmol/L — ABNORMAL LOW (ref 136–145)
Total Bilirubin: 0.3 MG/DL (ref 0.2–1.0)
Total Protein: 6.8 g/dL (ref 6.4–8.2)

## 2018-06-19 LAB — EKG 12-LEAD
Atrial Rate: 66 {beats}/min
Diagnosis: NORMAL
P Axis: 71 degrees
P-R Interval: 154 ms
Q-T Interval: 404 ms
QRS Duration: 88 ms
QTc Calculation (Bazett): 423 ms
R Axis: -25 degrees
T Axis: 58 degrees
Ventricular Rate: 66 {beats}/min

## 2018-06-19 LAB — URINALYSIS W/ REFLEX CULTURE
BACTERIA, URINE: NEGATIVE /hpf
Bacteria: NEGATIVE /hpf
Bilirubin, Urine: NEGATIVE
Bilirubin: NEGATIVE
Blood, Urine: NEGATIVE
Blood: NEGATIVE
Glucose, Ur: NEGATIVE mg/dL
Glucose: NEGATIVE mg/dL
Ketone: NEGATIVE mg/dL
Ketones, Urine: NEGATIVE mg/dL
Leukocyte Esterase, Urine: NEGATIVE
Leukocyte Esterase: NEGATIVE
Nitrite, Urine: NEGATIVE
Nitrites: NEGATIVE
Protein, UA: NEGATIVE mg/dL
Protein: NEGATIVE mg/dL
Specific Gravity, UA: 1.01 (ref 1.003–1.030)
Specific gravity: 1.01 (ref 1.003–1.030)
Urobilinogen, UA, POCT: 0.2 EU/dL (ref 0.2–1.0)
Urobilinogen: 0.2 EU/dL (ref 0.2–1.0)
pH (UA): 6 (ref 5.0–8.0)
pH, UA: 6 (ref 5.0–8.0)

## 2018-06-19 LAB — LACTIC ACID
Lactic Acid: 0.9 MMOL/L (ref 0.4–2.0)
Lactic acid: 0.9 MMOL/L (ref 0.4–2.0)

## 2018-06-19 LAB — LIPASE
Lipase: 166 U/L (ref 73–393)
Lipase: 166 U/L (ref 73–393)

## 2018-06-19 LAB — TROPONIN: Troponin I: <0.05 ng/mL (ref <0.05)

## 2018-06-19 LAB — CBC WITH AUTOMATED DIFF
ABS. BASOPHILS: 0 10*3/uL (ref 0.0–0.1)
ABS. EOSINOPHILS: 0.3 10*3/uL (ref 0.0–0.4)
ABS. IMM. GRANS.: 0 10*3/uL (ref 0.00–0.04)
ABS. LYMPHOCYTES: 1.6 10*3/uL (ref 0.8–3.5)
ABS. MONOCYTES: 0.8 10*3/uL (ref 0.0–1.0)
ABS. NEUTROPHILS: 5.3 10*3/uL (ref 1.8–8.0)
ABSOLUTE NRBC: 0 10*3/uL (ref 0.00–0.01)
BASOPHILS: 1 % (ref 0–1)
EOSINOPHILS: 4 % (ref 0–7)
HCT: 28.8 % — ABNORMAL LOW (ref 35.0–47.0)
HGB: 9.6 g/dL — ABNORMAL LOW (ref 11.5–16.0)
IMMATURE GRANULOCYTES: 0 % (ref 0.0–0.5)
LYMPHOCYTES: 20 % (ref 12–49)
MCH: 31.8 PG (ref 26.0–34.0)
MCHC: 33.3 g/dL (ref 30.0–36.5)
MCV: 95.4 FL (ref 80.0–99.0)
MONOCYTES: 10 % (ref 5–13)
MPV: 9.6 FL (ref 8.9–12.9)
NEUTROPHILS: 65 % (ref 32–75)
NRBC: 0 PER 100 WBC
PLATELET: 317 10*3/uL (ref 150–400)
RBC: 3.02 M/uL — ABNORMAL LOW (ref 3.80–5.20)
RDW: 13.8 % (ref 11.5–14.5)
WBC: 8.1 10*3/uL (ref 3.6–11.0)

## 2018-06-19 LAB — METABOLIC PANEL, COMPREHENSIVE
A-G Ratio: 0.9 — ABNORMAL LOW (ref 1.1–2.2)
ALT (SGPT): 19 U/L (ref 12–78)
AST (SGOT): 9 U/L — ABNORMAL LOW (ref 15–37)
Albumin: 3.2 g/dL — ABNORMAL LOW (ref 3.5–5.0)
Alk. phosphatase: 116 U/L (ref 45–117)
Anion gap: 11 mmol/L (ref 5–15)
BUN/Creatinine ratio: 10 — ABNORMAL LOW (ref 12–20)
BUN: 10 MG/DL (ref 6–20)
Bilirubin, total: 0.3 MG/DL (ref 0.2–1.0)
CO2: 25 mmol/L (ref 21–32)
Calcium: 9 MG/DL (ref 8.5–10.1)
Chloride: 92 mmol/L — ABNORMAL LOW (ref 97–108)
Creatinine: 1.02 MG/DL (ref 0.55–1.02)
GFR est AA: >60 mL/min/{1.73_m2} (ref >60)
GFR est non-AA: 52 mL/min/{1.73_m2} — ABNORMAL LOW (ref >60)
Globulin: 3.6 g/dL (ref 2.0–4.0)
Glucose: 88 mg/dL (ref 65–100)
Potassium: 4.3 mmol/L (ref 3.5–5.1)
Protein, total: 6.8 g/dL (ref 6.4–8.2)
Sodium: 128 mmol/L — ABNORMAL LOW (ref 136–145)

## 2018-06-19 LAB — EKG, 12 LEAD, INITIAL
Atrial Rate: 66 {beats}/min
Calculated P Axis: 71 degrees
Calculated R Axis: -25 degrees
Calculated T Axis: 58 degrees
Diagnosis: NORMAL
P-R Interval: 154 ms
Q-T Interval: 404 ms
QRS Duration: 88 ms
QTC Calculation (Bezet): 423 ms
Ventricular Rate: 66 {beats}/min

## 2018-06-19 LAB — TROPONIN I: Troponin-I, Qt.: <0.05 ng/mL (ref <0.05)

## 2018-06-19 LAB — SAMPLES BEING HELD

## 2018-06-19 MED ORDER — IOPAMIDOL 76 % IV SOLN
370 mg iodine /mL (76 %) | Freq: Once | INTRAVENOUS | Status: AC
Start: 2018-06-19 — End: 2018-06-19
  Administered 2018-06-19: 18:00:00 via INTRAVENOUS

## 2018-06-19 MED ORDER — SODIUM CHLORIDE 0.9% BOLUS IV
0.9 % | Freq: Once | INTRAVENOUS | Status: AC
Start: 2018-06-19 — End: 2018-06-19
  Administered 2018-06-19: 18:00:00 via INTRAVENOUS

## 2018-06-19 MED ORDER — LABETALOL 100 MG TAB
100 mg | ORAL | Status: AC
Start: 2018-06-19 — End: 2018-06-19
  Administered 2018-06-19: 19:00:00 via ORAL

## 2018-06-19 MED ORDER — ONDANSETRON (PF) 4 MG/2 ML INJECTION
4 mg/2 mL | INTRAMUSCULAR | Status: AC
Start: 2018-06-19 — End: 2018-06-19
  Administered 2018-06-19: 18:00:00 via INTRAVENOUS

## 2018-06-19 MED ORDER — SODIUM CHLORIDE 0.9 % IJ SYRG
Freq: Once | INTRAMUSCULAR | Status: AC
Start: 2018-06-19 — End: 2018-06-19
  Administered 2018-06-19: 18:00:00 via INTRAVENOUS

## 2018-06-19 MED ORDER — LABETALOL 5 MG/ML IV SOLN
5 mg/mL | INTRAVENOUS | Status: AC
Start: 2018-06-19 — End: 2018-06-19
  Administered 2018-06-19: 19:00:00 via INTRAVENOUS

## 2018-06-19 MED ORDER — ADV ADDAPTOR
3.375 gram | Status: AC
Start: 2018-06-19 — End: 2018-06-19
  Administered 2018-06-19: 21:00:00 via INTRAVENOUS

## 2018-06-19 MED FILL — SODIUM CHLORIDE 0.9 % IV: INTRAVENOUS | Qty: 1000

## 2018-06-19 MED FILL — LABETALOL 5 MG/ML IV SOLN: 5 mg/mL | INTRAVENOUS | Qty: 2

## 2018-06-19 MED FILL — PIPERACILLIN-TAZOBACTAM 3.375 GRAM IV SOLR: 3.375 gram | INTRAVENOUS | Qty: 3.38

## 2018-06-19 MED FILL — ONDANSETRON (PF) 4 MG/2 ML INJECTION: 4 mg/2 mL | INTRAMUSCULAR | Qty: 2

## 2018-06-19 MED FILL — LABETALOL 100 MG TAB: 100 mg | ORAL | Qty: 2

## 2018-06-19 MED FILL — LABETALOL 5 MG/ML IV SOLN: 5 mg/mL | INTRAVENOUS | Qty: 20

## 2018-06-19 MED FILL — ISOVUE-370  76 % INTRAVENOUS SOLUTION: 370 mg iodine /mL (76 %) | INTRAVENOUS | Qty: 100

## 2018-06-19 NOTE — ED Notes (Signed)
Verbal report given to Cali, RN

## 2018-06-19 NOTE — ED Notes (Signed)
Gen surg at bedside

## 2018-06-19 NOTE — ED Notes (Addendum)
Pt ambulated with walker to the bathroom at this time. Pt gait steady and upright.

## 2018-06-19 NOTE — ED Notes (Signed)
Patient arrives via transfer from [facility]

## 2018-06-19 NOTE — ED Notes (Signed)
Pt ambulated with walker to the bathroom at this time. Pt gait steady and upright.

## 2018-06-19 NOTE — ED Notes (Signed)
Pt resting on stretcher at this time. Denies any needs or questions. Daughter remains at bedside. Pt on ccm.

## 2018-06-19 NOTE — ED Notes (Signed)
Pt to CT

## 2018-06-19 NOTE — ED Notes (Signed)
Pt arrives ambulatory via walker with complaints of generalized abdominal pain, more upper per pt. Daughter states pt c/o lower abdominal pain yesterday.  Pt currently being treated for UTI. Started ATB on Tuesday. Denies N/V.

## 2018-06-19 NOTE — H&P (Signed)
Surgery History and Physical    Subjective:      Kellie Patterson is a 82 y.o. female who presented to [facility] complaing of 4day to 1 week history of abdominal pain. The pain is on the left side and moves towards the LLQ and suprapubic region. She was recently diagnosed with an UTI and has been on ABX for it. She has some nausea but no vomiting. She has had issue with her bowels in the past.     There are no active problems to display for this patient.    Past Medical History:   Diagnosis Date   ??? Chronic back pain    ??? Cognitive impairment    ??? Depression    ??? Hypothyroidism    ??? Osteoporosis    ??? Scoliosis       Past Surgical History:   Procedure Laterality Date   ??? HX GYN      HYSTERECTOMY      Social History     Tobacco Use   ??? Smoking status: Never Smoker   ??? Smokeless tobacco: Never Used   Substance Use Topics   ??? Alcohol use: Never     Frequency: Never      History reviewed. No pertinent family history.   Prior to Admission medications    Medication Sig Start Date End Date Taking? Authorizing Provider   acetaminophen (TYLENOL) 500 mg tablet Take 1,000 mg by mouth every six (6) hours as needed for Pain.    Other, Phys, MD   ARIPiprazole (ABILIFY) 15 mg tablet Take 15 mg by mouth daily.    Other, Phys, MD   calcium carbonate/vitamin D3 (CALCIUM + D PO) Take 600 mg by mouth daily. 600-mg-800 tab    Other, Phys, MD   cephALEXin (KEFLEX) 500 mg capsule Take 500 mg by mouth three (3) times daily. For 5 days    Other, Phys, MD   ferrous sulfate 325 mg (65 mg iron) tablet Take  by mouth daily (with dinner).    Other, Phys, MD   Omega-3 Fatty Acids (FISH OIL) 500 mg cap Take 1,000 mg by mouth.    Other, Phys, MD   lamoTRIgine (LAMICTAL) 100 mg tablet Take 100 mg by mouth daily.    Other, Phys, MD   levothyroxine (SYNTHROID) 125 mcg tablet Take 125 mcg by mouth Daily (before breakfast).    Other, Phys, MD   meloxicam (MOBIC) 7.5 mg tablet Take 7.5 mg by mouth daily.    Other, Phys, MD   phenelzine (NARDIL) 15 mg  tablet Take 15 mg by mouth three (3) times daily as needed.    Other, Phys, MD   therapeutic multivitamin (THERAGRAN) tablet Take 1 Tab by mouth daily.    Other, Phys, MD   cholecalciferol, vitamin D3, (VITAMIN D3 PO) Take 1,000 mg by mouth.    Other, Phys, MD   senna (SENEXON) 8.6 mg tablet Take 1 Tab by mouth nightly as needed for Constipation.    Other, Phys, MD     Allergies   Allergen Reactions   ??? Alendronate Unknown (comments)   ??? Aleve [Naproxen Sodium] Unknown (comments)   ??? Duloxetine Unknown (comments)   ??? Erythromycin Other (comments)   ??? Methylphenidate Other (comments)   ??? Morphine Itching   ??? Sulfa (Sulfonamide Antibiotics) Hives   ??? Vancomycin Unknown (comments)        Review of Systems:    Pertinent items are noted in the History of Present Illness.  Objective:     Visit Vitals  BP 167/47   Pulse 65   Temp 97.7 ??F (36.5 ??C)   Resp 13   Ht 4' 9"  (1.448 m)   Wt 171 lb 15.3 oz (78 kg)   SpO2 95%   BMI 37.21 kg/m??       Physical Exam:    GENERAL: alert, cooperative, no distress, appears stated age, EYE: negative, THROAT & NECK: normal, LUNG: clear to auscultation bilaterally, HEART: regular rate and rhythm, ABDOMEN: soft the patient has tenderness in all 4 quadrants. Her main focus of tenderness is in the LLQ and suprapubic region. Her RUQ has minimal tenderness there is no murphy sign.  EXTREMITIES:  no edema, SKIN: Normal., NEUROLOGIC: negative, PSYCH: non focal    Imaging:  images and reports reviewed  CT- Pericholecystic inflammatory stranding and equivocal gallbladder wall  thickening. Findings likely represent acute cholecystitis. Ultrasound can be  performed for further evaluation, as indicated.  2. No aortic dissection.  3. Age indeterminate T7, T11, L1, L2 compression fractures  Ultrasound- Thick-walled, tender gallbladder with small amount of pericholecystic fluid. No  gallstones demonstrated, but gallbladder fundus not clearly seen. Mild biliary  dilatation with a common duct diameter of 7  mm  Lab Review:    Recent Results (from the past 24 hour(s))   SAMPLES BEING HELD    Collection Time: 06/19/18 12:03 PM   Result Value Ref Range    SAMPLES BEING HELD 1RED, 1BLUE, 1PST, 1LAV     COMMENT        Add-on orders for these samples will be processed based on acceptable specimen integrity and analyte stability, which may vary by analyte.   CBC WITH AUTOMATED DIFF    Collection Time: 06/19/18 12:03 PM   Result Value Ref Range    WBC 8.1 3.6 - 11.0 K/uL    RBC 3.02 (L) 3.80 - 5.20 M/uL    HGB 9.6 (L) 11.5 - 16.0 g/dL    HCT 28.8 (L) 35.0 - 47.0 %    MCV 95.4 80.0 - 99.0 FL    MCH 31.8 26.0 - 34.0 PG    MCHC 33.3 30.0 - 36.5 g/dL    RDW 13.8 11.5 - 14.5 %    PLATELET 317 150 - 400 K/uL    MPV 9.6 8.9 - 12.9 FL    NRBC 0.0 0 PER 100 WBC    ABSOLUTE NRBC 0.00 0.00 - 0.01 K/uL    NEUTROPHILS 65 32 - 75 %    LYMPHOCYTES 20 12 - 49 %    MONOCYTES 10 5 - 13 %    EOSINOPHILS 4 0 - 7 %    BASOPHILS 1 0 - 1 %    IMMATURE GRANULOCYTES 0 0.0 - 0.5 %    ABS. NEUTROPHILS 5.3 1.8 - 8.0 K/UL    ABS. LYMPHOCYTES 1.6 0.8 - 3.5 K/UL    ABS. MONOCYTES 0.8 0.0 - 1.0 K/UL    ABS. EOSINOPHILS 0.3 0.0 - 0.4 K/UL    ABS. BASOPHILS 0.0 0.0 - 0.1 K/UL    ABS. IMM. GRANS. 0.0 0.00 - 0.04 K/UL    DF AUTOMATED     LIPASE    Collection Time: 06/19/18 12:03 PM   Result Value Ref Range    Lipase 166 73 - 829 U/L   METABOLIC PANEL, COMPREHENSIVE    Collection Time: 06/19/18 12:03 PM   Result Value Ref Range    Sodium 128 (L) 136 - 145 mmol/L  Potassium 4.3 3.5 - 5.1 mmol/L    Chloride 92 (L) 97 - 108 mmol/L    CO2 25 21 - 32 mmol/L    Anion gap 11 5 - 15 mmol/L    Glucose 88 65 - 100 mg/dL    BUN 10 6 - 20 MG/DL    Creatinine 1.02 0.55 - 1.02 MG/DL    BUN/Creatinine ratio 10 (L) 12 - 20      GFR est AA >60 >60 ml/min/1.80m    GFR est non-AA 52 (L) >60 ml/min/1.769m   Calcium 9.0 8.5 - 10.1 MG/DL    Bilirubin, total 0.3 0.2 - 1.0 MG/DL    ALT (SGPT) 19 12 - 78 U/L    AST (SGOT) 9 (L) 15 - 37 U/L    Alk. phosphatase 116 45 - 117 U/L     Protein, total 6.8 6.4 - 8.2 g/dL    Albumin 3.2 (L) 3.5 - 5.0 g/dL    Globulin 3.6 2.0 - 4.0 g/dL    A-G Ratio 0.9 (L) 1.1 - 2.2     TROPONIN I    Collection Time: 06/19/18 12:03 PM   Result Value Ref Range    Troponin-I, Qt. <0.05 <0.05 ng/mL   URINALYSIS W/ REFLEX CULTURE    Collection Time: 06/19/18 12:23 PM   Result Value Ref Range    Color YELLOW/STRAW      Appearance CLEAR CLEAR      Specific gravity 1.010 1.003 - 1.030      pH (UA) 6.0 5.0 - 8.0      Protein NEGATIVE  NEG mg/dL    Glucose NEGATIVE  NEG mg/dL    Ketone NEGATIVE  NEG mg/dL    Bilirubin NEGATIVE  NEG      Blood NEGATIVE  NEG      Urobilinogen 0.2 0.2 - 1.0 EU/dL    Nitrites NEGATIVE  NEG      Leukocyte Esterase NEGATIVE  NEG      WBC 0-4 0 - 4 /hpf    RBC 0-5 0 - 5 /hpf    Epithelial cells FEW FEW /lpf    Bacteria NEGATIVE  NEG /hpf    UA:UC IF INDICATED CULTURE NOT INDICATED BY UA RESULT     EKG, 12 LEAD, INITIAL    Collection Time: 06/19/18 12:47 PM   Result Value Ref Range    Ventricular Rate 66 BPM    Atrial Rate 66 BPM    P-R Interval 154 ms    QRS Duration 88 ms    Q-T Interval 404 ms    QTC Calculation (Bezet) 423 ms    Calculated P Axis 71 degrees    Calculated R Axis -25 degrees    Calculated T Axis 58 degrees    Diagnosis       Normal sinus rhythm  Left anterior hemi-block  Voltage criteria for left ventricular hypertrophy  No previous ECGs available  Confirmed by BuQuay BurowMD, CaHoyle [phone] 06/19/2018 3:51:56 PM     LACTIC ACID    Collection Time: 06/19/18  1:08 PM   Result Value Ref Range    Lactic acid 0.9 0.4 - 2.0 MMOL/L       Assessment:   Abdominal pain.   While the patients studies are consistent with cholecystitis her physical examination with mainly LLQ tenderness is not consistent with this.   I am also concerned about how long this has been going on possibly up to a week which would make Cholecystectomy  much more difficult.   Will try to treat nonoperatively at this point. Will get HIDA to confirm the diagnosis. Hopfully  she will be able to be treated with abx alone. If this is not possible may even need a cholecystosotomy tube to allow the gallbladder to settle down prior to cholecystectomy.

## 2018-06-19 NOTE — ED Provider Notes (Signed)
82 year old female with history of hypothyroidism, chronic back pain, presenting to the ED complaining of epigastric pain.she reports 2-3 days in duration however yesterday it was lower abdominal pain. ddaughter does report recent diagnosis of UTI. Patient has had  No vomiting but mild nausea.. Normal bowel movements. No fevers or chills. No chest pain or shortness of breath.  The patient describes the pain as aching and deep in nature with no radiation. She denies current NSAID use.           Past Medical History:   Diagnosis Date   ??? Chronic back pain    ??? Cognitive impairment    ??? Depression    ??? Hypothyroidism    ??? Osteoporosis    ??? Scoliosis        Past Surgical History:   Procedure Laterality Date   ??? HX GYN      HYSTERECTOMY         History reviewed. No pertinent family history.    Social History     Socioeconomic History   ??? Marital status: Not on file     Spouse name: Not on file   ??? Number of children: Not on file   ??? Years of education: Not on file   ??? Highest education level: Not on file   Occupational History   ??? Not on file   Social Needs   ??? Financial resource strain: Not on file   ??? Food insecurity:     Worry: Not on file     Inability: Not on file   ??? Transportation needs:     Medical: Not on file     Non-medical: Not on file   Tobacco Use   ??? Smoking status: Never Smoker   ??? Smokeless tobacco: Never Used   Substance and Sexual Activity   ??? Alcohol use: Never     Frequency: Never   ??? Drug use: Never   ??? Sexual activity: Not on file   Lifestyle   ??? Physical activity:     Days per week: Not on file     Minutes per session: Not on file   ??? Stress: Not on file   Relationships   ??? Social connections:     Talks on phone: Not on file     Gets together: Not on file     Attends religious service: Not on file     Active member of club or organization: Not on file     Attends meetings of clubs or organizations: Not on file     Relationship status: Not on file   ??? Intimate partner violence:     Fear of current  or ex partner: Not on file     Emotionally abused: Not on file     Physically abused: Not on file     Forced sexual activity: Not on file   Other Topics Concern   ??? Not on file   Social History Narrative   ??? Not on file         ALLERGIES: Alendronate; Aleve [naproxen sodium]; Duloxetine; Erythromycin; Methylphenidate; Morphine; Sulfa (sulfonamide antibiotics); and Vancomycin    Review of Systems   Constitutional: Negative for chills and fever.   HENT: Negative for congestion.    Eyes: Negative for visual disturbance.   Respiratory: Negative for shortness of breath.    Cardiovascular: Negative for chest pain.   Gastrointestinal: Positive for abdominal pain. Negative for nausea and vomiting.   Genitourinary: Negative for dysuria and  hematuria.   Musculoskeletal: Negative for back pain.   Skin: Negative for rash.   Allergic/Immunologic: Negative for immunocompromised state.   Neurological: Negative for syncope, weakness and headaches.   Psychiatric/Behavioral: Negative for confusion.       Vitals:    06/19/18 1201   BP: 192/63   Pulse: 69   Resp: 16   Temp: 98.6 ??F (37 ??C)   SpO2: 95%   Weight: 78 kg (171 lb 15.3 oz)   Height: 4' 9"  (1.448 m)            Physical Exam  Vitals signs and nursing note reviewed.   Constitutional:       General: She is not in acute distress.     Appearance: She is well-developed.   HENT:      Head: Normocephalic and atraumatic.   Eyes:      General: No scleral icterus.  Neck:      Trachea: No tracheal deviation.   Cardiovascular:      Rate and Rhythm: Normal rate and regular rhythm.      Heart sounds: Normal heart sounds.      Comments: Severe hypertension  Pulmonary:      Effort: Pulmonary effort is normal. No respiratory distress.      Breath sounds: Normal breath sounds.   Abdominal:      General: There is no distension.      Palpations: Abdomen is soft.      Tenderness: There is tenderness in the epigastric area.   Musculoskeletal: Normal range of motion.   Skin:     General: Skin is  warm and dry.   Neurological:      Mental Status: She is alert and oriented to person, place, and time.      Cranial Nerves: No cranial nerve deficit.          MDM  Number of Diagnoses or Management Options  Diagnosis management comments: 82-year-old female history of recent UTI, presenting to the ED severely hypertensive,, complaining of deep epigastric pain. While she has no history of vascular disease or smoking, concerned about aortic catastrophe. Limited bedside ultrasound shows no obvious aortic aneurysm. The study was limited however for her diagnosis of dissection. We'll pursue CTA of the chest and abdomen to better evaluate her aorta. There is no history of alcohol abuseor gallstones which would make me suspect pancreatitis. We'll obtain EKG and troponin on the off chance this is actually ACS. Blood pressure control with labetalol in-home antihypertensives.       Amount and/or Complexity of Data Reviewed  Clinical lab tests: ordered  Tests in the radiology section of CPT??: ordered        ED EKG interpretation:  Rhythm: normal sinus rhythm; and regular . Rate (approx.): 66; Axis: normal; P wave: normal; QRS interval: normal ; ST/T wave: normal; in  Lead: ALL; Other findings: LVH .   EKG documented by , as interpreted by Theresia Bough, MD, ED MD.    Recent Results (from the past 12 hour(s))   SAMPLES BEING HELD    Collection Time: 06/19/18 12:03 PM   Result Value Ref Range    SAMPLES BEING HELD 1RED, 1BLUE, 1PST, 1LAV     COMMENT        Add-on orders for these samples will be processed based on acceptable specimen integrity and analyte stability, which may vary by analyte.   CBC WITH AUTOMATED DIFF    Collection Time: 06/19/18 12:03 PM   Result  Value Ref Range    WBC 8.1 3.6 - 11.0 K/uL    RBC 3.02 (L) 3.80 - 5.20 M/uL    HGB 9.6 (L) 11.5 - 16.0 g/dL    HCT 28.8 (L) 35.0 - 47.0 %    MCV 95.4 80.0 - 99.0 FL    MCH 31.8 26.0 - 34.0 PG    MCHC 33.3 30.0 - 36.5 g/dL    RDW 13.8 11.5 - 14.5 %    PLATELET 317  150 - 400 K/uL    MPV 9.6 8.9 - 12.9 FL    NRBC 0.0 0 PER 100 WBC    ABSOLUTE NRBC 0.00 0.00 - 0.01 K/uL    NEUTROPHILS 65 32 - 75 %    LYMPHOCYTES 20 12 - 49 %    MONOCYTES 10 5 - 13 %    EOSINOPHILS 4 0 - 7 %    BASOPHILS 1 0 - 1 %    IMMATURE GRANULOCYTES 0 0.0 - 0.5 %    ABS. NEUTROPHILS 5.3 1.8 - 8.0 K/UL    ABS. LYMPHOCYTES 1.6 0.8 - 3.5 K/UL    ABS. MONOCYTES 0.8 0.0 - 1.0 K/UL    ABS. EOSINOPHILS 0.3 0.0 - 0.4 K/UL    ABS. BASOPHILS 0.0 0.0 - 0.1 K/UL    ABS. IMM. GRANS. 0.0 0.00 - 0.04 K/UL    DF AUTOMATED     LIPASE    Collection Time: 06/19/18 12:03 PM   Result Value Ref Range    Lipase 166 73 - 161 U/L   METABOLIC PANEL, COMPREHENSIVE    Collection Time: 06/19/18 12:03 PM   Result Value Ref Range    Sodium 128 (L) 136 - 145 mmol/L    Potassium 4.3 3.5 - 5.1 mmol/L    Chloride 92 (L) 97 - 108 mmol/L    CO2 25 21 - 32 mmol/L    Anion gap 11 5 - 15 mmol/L    Glucose 88 65 - 100 mg/dL    BUN 10 6 - 20 MG/DL    Creatinine 1.02 0.55 - 1.02 MG/DL    BUN/Creatinine ratio 10 (L) 12 - 20      GFR est AA >60 >60 ml/min/1.15m    GFR est non-AA 52 (L) >60 ml/min/1.750m   Calcium 9.0 8.5 - 10.1 MG/DL    Bilirubin, total 0.3 0.2 - 1.0 MG/DL    ALT (SGPT) 19 12 - 78 U/L    AST (SGOT) 9 (L) 15 - 37 U/L    Alk. phosphatase 116 45 - 117 U/L    Protein, total 6.8 6.4 - 8.2 g/dL    Albumin 3.2 (L) 3.5 - 5.0 g/dL    Globulin 3.6 2.0 - 4.0 g/dL    A-G Ratio 0.9 (L) 1.1 - 2.2     TROPONIN I    Collection Time: 06/19/18 12:03 PM   Result Value Ref Range    Troponin-I, Qt. <0.05 <0.05 ng/mL   URINALYSIS W/ REFLEX CULTURE    Collection Time: 06/19/18 12:23 PM   Result Value Ref Range    Color YELLOW/STRAW      Appearance CLEAR CLEAR      Specific gravity 1.010 1.003 - 1.030      pH (UA) 6.0 5.0 - 8.0      Protein NEGATIVE  NEG mg/dL    Glucose NEGATIVE  NEG mg/dL    Ketone NEGATIVE  NEG mg/dL    Bilirubin NEGATIVE  NEG  Blood NEGATIVE  NEG      Urobilinogen 0.2 0.2 - 1.0 EU/dL    Nitrites NEGATIVE  NEG      Leukocyte  Esterase NEGATIVE  NEG      WBC 0-4 0 - 4 /hpf    RBC 0-5 0 - 5 /hpf    Epithelial cells FEW FEW /lpf    Bacteria NEGATIVE  NEG /hpf    UA:UC IF INDICATED CULTURE NOT INDICATED BY UA RESULT     EKG, 12 LEAD, INITIAL    Collection Time: 06/19/18 12:47 PM   Result Value Ref Range    Ventricular Rate 66 BPM    Atrial Rate 66 BPM    P-R Interval 154 ms    QRS Duration 88 ms    Q-T Interval 404 ms    QTC Calculation (Bezet) 423 ms    Calculated P Axis 71 degrees    Calculated R Axis -25 degrees    Calculated T Axis 58 degrees    Diagnosis       Normal sinus rhythm  Voltage criteria for left ventricular hypertrophy  Abnormal ECG  No previous ECGs available     LACTIC ACID    Collection Time: 06/19/18  1:08 PM   Result Value Ref Range    Lactic acid 0.9 0.4 - 2.0 MMOL/L       Procedures    2:00 PM  Awaiting results of CTAs.  BP improved after labetolol.  BP 194/57    Pulse 63    Temp 98.6 ??F (37 ??C)    Resp 16    Ht 4' 9"  (1.448 m)    Wt 78 kg (171 lb 15.3 oz)    SpO2 94%    BMI 37.21 kg/m??   Labs notable for hyponatremia.  If imaging negative and patient continues to feel improved, will discharge back to SNF with communication to monitor sodium as this can exacerbate memory issues/dementia.    CT Results  (Last 48 hours)               06/19/18 1324  CTA CHEST W OR W WO CONT Final result    Impression:  IMPRESSION:    1. Pericholecystic inflammatory stranding and equivocal gallbladder wall   thickening. Findings likely represent acute cholecystitis. Ultrasound can be   performed for further evaluation, as indicated.   2. No aortic dissection.   3. Age indeterminate T7, T11, L1, L2 compression fractures.               Narrative:  INDICATION: Urinary tract infection. Generalized abdominal pain. Back pain.       CT arteriogram of the chest, abdomen and pelvis is performed with 2.5 mm   collimation. Study is performed with 100 cc of nonionic IV Isovue-370. 3-D post   processed images were performed.       CT dose reduction was  achieved with the use of the standardized protocol   tailored for this examination and automatic exposure control for dose   modulation.       There is no prior study for direct comparison.       Chest:       CTA: The thoracic and abdominal aorta is normal in course and caliber and there   is no aortic dissection. Atherosclerotic calcifications noted within the   thoracic and abdominal aorta. Proximal celiac and SMA are patent. There is a   single left renal artery and a single right renal artery, both  of which are   patent.       Lungs: There is minimal bibasilar dependent atelectasis.       Lymph nodes: There is no mediastinal, hilar or axillary lymphadenopathy.       Pleura: The pleural spaces are normal.       Heart: The heart is normal in size and there is no pericardial fluid.       Bones: The osseous structures are diffusely demineralized. There are age   indeterminate severe T7, T11 and L1 compression deformities. There is also an   age indeterminate moderate L2 compression deformity.       Abdomen/pelvis:       Liver: The liver is normal.       Spleen: The spleen is normal.       Pancreas: The pancreas is normal.       Adrenals: The adrenals are normal.       Gallbladder: There is pericholecystic inflammatory stranding. Gallbladder does   not appear distended there is equivocal gallbladder wall thickening.       Kidneys: The kidneys are normal.       Bowel: No dilated or thickened loop of large or small bowel is seen. Scattered   colonic diverticulosis is noted.       Appendix: The appendix is normal.       Urinary bladder: Urinary bladder is partially filled and grossly normal.       Bones: No lytic or sclerotic osseous lesion is visualized.       Miscellaneous: There is a moderate-sized hiatal hernia. There is no free   intraperitoneal gas or fluid. There is no focal fluid collection to suggest   abscess. Uterus is absent.           06/19/18 1324  CTA ABDOMEN PELV W CONT Final result    Impression:   IMPRESSION:    1. Pericholecystic inflammatory stranding and equivocal gallbladder wall   thickening. Findings likely represent acute cholecystitis. Ultrasound can be   performed for further evaluation, as indicated.   2. No aortic dissection.   3. Age indeterminate T7, T11, L1, L2 compression fractures.               Narrative:  INDICATION: Urinary tract infection. Generalized abdominal pain. Back pain.       CT arteriogram of the chest, abdomen and pelvis is performed with 2.5 mm   collimation. Study is performed with 100 cc of nonionic IV Isovue-370. 3-D post   processed images were performed.       CT dose reduction was achieved with the use of the standardized protocol   tailored for this examination and automatic exposure control for dose   modulation.       There is no prior study for direct comparison.       Chest:       CTA: The thoracic and abdominal aorta is normal in course and caliber and there   is no aortic dissection. Atherosclerotic calcifications noted within the   thoracic and abdominal aorta. Proximal celiac and SMA are patent. There is a   single left renal artery and a single right renal artery, both of which are   patent.       Lungs: There is minimal bibasilar dependent atelectasis.       Lymph nodes: There is no mediastinal, hilar or axillary lymphadenopathy.       Pleura: The pleural spaces are normal.  Heart: The heart is normal in size and there is no pericardial fluid.       Bones: The osseous structures are diffusely demineralized. There are age   indeterminate severe T7, T11 and L1 compression deformities. There is also an   age indeterminate moderate L2 compression deformity.       Abdomen/pelvis:       Liver: The liver is normal.       Spleen: The spleen is normal.       Pancreas: The pancreas is normal.       Adrenals: The adrenals are normal.       Gallbladder: There is pericholecystic inflammatory stranding. Gallbladder does   not appear distended there is equivocal  gallbladder wall thickening.       Kidneys: The kidneys are normal.       Bowel: No dilated or thickened loop of large or small bowel is seen. Scattered   colonic diverticulosis is noted.       Appendix: The appendix is normal.       Urinary bladder: Urinary bladder is partially filled and grossly normal.       Bones: No lytic or sclerotic osseous lesion is visualized.       Miscellaneous: There is a moderate-sized hiatal hernia. There is no free   intraperitoneal gas or fluid. There is no focal fluid collection to suggest   abscess. Uterus is absent.               PFT Results  (Last 48 hours)    None        Echo Results  (Last 48 hours)    None        CXR Results  (Last 48 hours)    None        VENOUS DOPPLER results  (Last 48 hours)    None        2:13 PM  CTA notable for possible cholecystitis.  Recommend [place] to correlate.  Ordered.      3:34 PM  US shows acute cholecystitis.  General Surgery consulted and I spoke with NP Manus Rudd who will discuss case and review images with Attending and advise on admission to General Surgery vs Hospitalist service.      3:43 PM  Per NP Manus Rudd, in review with attending, this could be chronic and not acute.  They would like her sent to [facility] ED where they had do a physical evaluation to help determine disposition.  She may not require admission depending on their determination.    Signed By: Theresia Bough, MD     June 19, 2018

## 2018-06-19 NOTE — ED Notes (Signed)
Ok to give Labetalol with HR in the 60's per MD. Pt placed on CCM at this time.

## 2018-06-19 NOTE — ED Notes (Signed)
2000- Bedside and Verbal shift change report given to [facility] (oncoming nurse) by [facility] (offgoing nurse). Report included the following information ED Summary and Recent Results.

## 2018-06-19 NOTE — ED Notes (Signed)
Pt transferred in stable condition at this time. Report given to [facility]. [facility].       Daughter took home shirt, bra, walker and back brace.

## 2018-06-19 NOTE — ED Notes (Signed)
US at bedside

## 2018-06-19 NOTE — ED Triage Notes (Signed)
Patient arrives via transfer from [facility]

## 2018-06-19 NOTE — ED Notes (Signed)
Pt resting on stretcher at this time. Denies any needs or questions. Daughter remains at bedside. Pt on ccm.

## 2018-06-19 NOTE — ED Notes (Signed)
2000- Bedside and Verbal shift change report given to [facility] (oncoming nurse) by [facility] (offgoing nurse). Report included the following information ED Summary and Recent Results.

## 2018-06-19 NOTE — H&P (Signed)
Surgery History and Physical    Subjective:      Kellie Patterson is a 82 y.o. female who presented to [facility] complaing of 4day to 1 week history of abdominal pain. The pain is on the left side and moves towards the LLQ and suprapubic region. She was recently diagnosed with an UTI and has been on ABX for it. She has some nausea but no vomiting. She has had issue with her bowels in the past.     There are no active problems to display for this patient.    Past Medical History:   Diagnosis Date   ??? Chronic back pain    ??? Cognitive impairment    ??? Depression    ??? Hypothyroidism    ??? Osteoporosis    ??? Scoliosis       Past Surgical History:   Procedure Laterality Date   ??? HX GYN      HYSTERECTOMY      Social History     Tobacco Use   ??? Smoking status: Never Smoker   ??? Smokeless tobacco: Never Used   Substance Use Topics   ??? Alcohol use: Never     Frequency: Never      History reviewed. No pertinent family history.   Prior to Admission medications    Medication Sig Start Date End Date Taking? Authorizing Provider   acetaminophen (TYLENOL) 500 mg tablet Take 1,000 mg by mouth every six (6) hours as needed for Pain.    Other, Phys, MD   ARIPiprazole (ABILIFY) 15 mg tablet Take 15 mg by mouth daily.    Other, Phys, MD   calcium carbonate/vitamin D3 (CALCIUM + D PO) Take 600 mg by mouth daily. 600-mg-800 tab    Other, Phys, MD   cephALEXin (KEFLEX) 500 mg capsule Take 500 mg by mouth three (3) times daily. For 5 days    Other, Phys, MD   ferrous sulfate 325 mg (65 mg iron) tablet Take  by mouth daily (with dinner).    Other, Phys, MD   Omega-3 Fatty Acids (FISH OIL) 500 mg cap Take 1,000 mg by mouth.    Other, Phys, MD   lamoTRIgine (LAMICTAL) 100 mg tablet Take 100 mg by mouth daily.    Other, Phys, MD   levothyroxine (SYNTHROID) 125 mcg tablet Take 125 mcg by mouth Daily (before breakfast).    Other, Phys, MD   meloxicam (MOBIC) 7.5 mg tablet Take 7.5 mg by mouth daily.    Other, Phys, MD    phenelzine (NARDIL) 15 mg tablet Take 15 mg by mouth three (3) times daily as needed.    Other, Phys, MD   therapeutic multivitamin (THERAGRAN) tablet Take 1 Tab by mouth daily.    Other, Phys, MD   cholecalciferol, vitamin D3, (VITAMIN D3 PO) Take 1,000 mg by mouth.    Other, Phys, MD   senna (SENEXON) 8.6 mg tablet Take 1 Tab by mouth nightly as needed for Constipation.    Other, Phys, MD     Allergies   Allergen Reactions   ??? Alendronate Unknown (comments)   ??? Aleve [Naproxen Sodium] Unknown (comments)   ??? Duloxetine Unknown (comments)   ??? Erythromycin Other (comments)   ??? Methylphenidate Other (comments)   ??? Morphine Itching   ??? Sulfa (Sulfonamide Antibiotics) Hives   ??? Vancomycin Unknown (comments)        Review of Systems:    Pertinent items are noted in the History of Present Illness.  Objective:     Visit Vitals  BP 167/47   Pulse 65   Temp 97.7 ??F (36.5 ??C)   Resp 13   Ht _0  (1.448 m)   Wt 171 lb 15.3 oz (78 kg)   SpO2 95%   BMI 37.21 kg/m??       Physical Exam:    GENERAL: alert, cooperative, no distress, appears stated age, EYE: negative, THROAT & NECK: normal, LUNG: clear to auscultation bilaterally, HEART: regular rate and rhythm, ABDOMEN: soft the patient has tenderness in all 4 quadrants. Her main focus of tenderness is in the LLQ and suprapubic region. Her RUQ has minimal tenderness there is no murphy sign.  EXTREMITIES:  no edema, SKIN: Normal., NEUROLOGIC: negative, PSYCH: non focal    Imaging:  images and reports reviewed  CT- Pericholecystic inflammatory stranding and equivocal gallbladder wall  thickening. Findings likely represent acute cholecystitis. Ultrasound can be  performed for further evaluation, as indicated.  2. No aortic dissection.  3. Age indeterminate T7, T11, L1, L2 compression fractures  Ultrasound- Thick-walled, tender gallbladder with small amount of pericholecystic fluid. No  gallstones demonstrated, but gallbladder fundus not clearly seen. Mild biliary   dilatation with a common duct diameter of 7 mm  Lab Review:    Recent Results (from the past 24 hour(s))   SAMPLES BEING HELD    Collection Time: 06/19/18 12:03 PM   Result Value Ref Range    SAMPLES BEING HELD 1RED, 1BLUE, 1PST, 1LAV     COMMENT        Add-on orders for these samples will be processed based on acceptable specimen integrity and analyte stability, which may vary by analyte.   CBC WITH AUTOMATED DIFF    Collection Time: 06/19/18 12:03 PM   Result Value Ref Range    WBC 8.1 3.6 - 11.0 K/uL    RBC 3.02 (L) 3.80 - 5.20 M/uL    HGB 9.6 (L) 11.5 - 16.0 g/dL    HCT 28.8 (L) 35.0 - 47.0 %    MCV 95.4 80.0 - 99.0 FL    MCH 31.8 26.0 - 34.0 PG    MCHC 33.3 30.0 - 36.5 g/dL    RDW 13.8 11.5 - 14.5 %    PLATELET 317 150 - 400 K/uL    MPV 9.6 8.9 - 12.9 FL    NRBC 0.0 0 PER 100 WBC    ABSOLUTE NRBC 0.00 0.00 - 0.01 K/uL    NEUTROPHILS 65 32 - 75 %    LYMPHOCYTES 20 12 - 49 %    MONOCYTES 10 5 - 13 %    EOSINOPHILS 4 0 - 7 %    BASOPHILS 1 0 - 1 %    IMMATURE GRANULOCYTES 0 0.0 - 0.5 %    ABS. NEUTROPHILS 5.3 1.8 - 8.0 K/UL    ABS. LYMPHOCYTES 1.6 0.8 - 3.5 K/UL    ABS. MONOCYTES 0.8 0.0 - 1.0 K/UL    ABS. EOSINOPHILS 0.3 0.0 - 0.4 K/UL    ABS. BASOPHILS 0.0 0.0 - 0.1 K/UL    ABS. IMM. GRANS. 0.0 0.00 - 0.04 K/UL    DF AUTOMATED     LIPASE    Collection Time: 06/19/18 12:03 PM   Result Value Ref Range    Lipase 166 73 - 564 U/L   METABOLIC PANEL, COMPREHENSIVE    Collection Time: 06/19/18 12:03 PM   Result Value Ref Range    Sodium 128 (L) 136 - 145 mmol/L  Potassium 4.3 3.5 - 5.1 mmol/L    Chloride 92 (L) 97 - 108 mmol/L    CO2 25 21 - 32 mmol/L    Anion gap 11 5 - 15 mmol/L    Glucose 88 65 - 100 mg/dL    BUN 10 6 - 20 MG/DL    Creatinine 1.02 0.55 - 1.02 MG/DL    BUN/Creatinine ratio 10 (L) 12 - 20      GFR est AA >60 >60 ml/min/1.26m    GFR est non-AA 52 (L) >60 ml/min/1.768m   Calcium 9.0 8.5 - 10.1 MG/DL    Bilirubin, total 0.3 0.2 - 1.0 MG/DL    ALT (SGPT) 19 12 - 78 U/L     AST (SGOT) 9 (L) 15 - 37 U/L    Alk. phosphatase 116 45 - 117 U/L    Protein, total 6.8 6.4 - 8.2 g/dL    Albumin 3.2 (L) 3.5 - 5.0 g/dL    Globulin 3.6 2.0 - 4.0 g/dL    A-G Ratio 0.9 (L) 1.1 - 2.2     TROPONIN I    Collection Time: 06/19/18 12:03 PM   Result Value Ref Range    Troponin-I, Qt. <0.05 <0.05 ng/mL   URINALYSIS W/ REFLEX CULTURE    Collection Time: 06/19/18 12:23 PM   Result Value Ref Range    Color YELLOW/STRAW      Appearance CLEAR CLEAR      Specific gravity 1.010 1.003 - 1.030      pH (UA) 6.0 5.0 - 8.0      Protein NEGATIVE  NEG mg/dL    Glucose NEGATIVE  NEG mg/dL    Ketone NEGATIVE  NEG mg/dL    Bilirubin NEGATIVE  NEG      Blood NEGATIVE  NEG      Urobilinogen 0.2 0.2 - 1.0 EU/dL    Nitrites NEGATIVE  NEG      Leukocyte Esterase NEGATIVE  NEG      WBC 0-4 0 - 4 /hpf    RBC 0-5 0 - 5 /hpf    Epithelial cells FEW FEW /lpf    Bacteria NEGATIVE  NEG /hpf    UA:UC IF INDICATED CULTURE NOT INDICATED BY UA RESULT     EKG, 12 LEAD, INITIAL    Collection Time: 06/19/18 12:47 PM   Result Value Ref Range    Ventricular Rate 66 BPM    Atrial Rate 66 BPM    P-R Interval 154 ms    QRS Duration 88 ms    Q-T Interval 404 ms    QTC Calculation (Bezet) 423 ms    Calculated P Axis 71 degrees    Calculated R Axis -25 degrees    Calculated T Axis 58 degrees    Diagnosis       Normal sinus rhythm  Left anterior hemi-block  Voltage criteria for left ventricular hypertrophy  No previous ECGs available  Confirmed by BuQuay BurowMD, CaHoyle [phone] 06/19/2018 3:51:56 PM     LACTIC ACID    Collection Time: 06/19/18  1:08 PM   Result Value Ref Range    Lactic acid 0.9 0.4 - 2.0 MMOL/L       Assessment:   Abdominal pain.   While the patients studies are consistent with cholecystitis her physical examination with mainly LLQ tenderness is not consistent with this.   I am also concerned about how long this has been going on possibly up to a week which would make Cholecystectomy  much more difficult.    Will try to treat nonoperatively at this point. Will get HIDA to confirm the diagnosis. Hopfully she will be able to be treated with abx alone. If this is not possible may even need a cholecystosotomy tube to allow the gallbladder to settle down prior to cholecystectomy.

## 2018-06-19 NOTE — ED Triage Notes (Addendum)
Pt arrives ambulatory via walker with complaints of generalized abdominal pain, more upper per pt. Daughter states pt c/o lower abdominal pain yesterday.  Pt currently being treated for UTI. Started ATB on Tuesday. Denies N/V.

## 2018-06-19 NOTE — ED Notes (Signed)
US at bedside.

## 2018-06-19 NOTE — ED Notes (Signed)
Pt transferred in stable condition at this time. Report given to [facility]. Confirmed transport to [facility] ED.       Daughter took home shirt, bra, walker and back brace.

## 2018-06-19 NOTE — ED Notes (Addendum)
Ok to give Labetalol with HR in the 60's per MD. Pt placed on CCM at this time.

## 2018-06-19 NOTE — ED Provider Notes (Signed)
82 year old female with history of hypothyroidism, chronic back pain, presenting to the ED complaining of epigastric pain.she reports 2-3 days in duration however yesterday it was lower abdominal pain. ddaughter does report recent diagnosis of UTI. Patient has had  No vomiting but mild nausea.. Normal bowel movements. No fevers or chills. No chest pain or shortness of breath.  The patient describes the pain as aching and deep in nature with no radiation. She denies current NSAID use.           Past Medical History:   Diagnosis Date   ??? Chronic back pain    ??? Cognitive impairment    ??? Depression    ??? Hypothyroidism    ??? Osteoporosis    ??? Scoliosis        Past Surgical History:   Procedure Laterality Date   ??? HX GYN      HYSTERECTOMY         History reviewed. No pertinent family history.    Social History     Socioeconomic History   ??? Marital status: Not on file     Spouse name: Not on file   ??? Number of children: Not on file   ??? Years of education: Not on file   ??? Highest education level: Not on file   Occupational History   ??? Not on file   Social Needs   ??? Financial resource strain: Not on file   ??? Food insecurity:     Worry: Not on file     Inability: Not on file   ??? Transportation needs:     Medical: Not on file     Non-medical: Not on file   Tobacco Use   ??? Smoking status: Never Smoker   ??? Smokeless tobacco: Never Used   Substance and Sexual Activity   ??? Alcohol use: Never     Frequency: Never   ??? Drug use: Never   ??? Sexual activity: Not on file   Lifestyle   ??? Physical activity:     Days per week: Not on file     Minutes per session: Not on file   ??? Stress: Not on file   Relationships   ??? Social connections:     Talks on phone: Not on file     Gets together: Not on file     Attends religious service: Not on file     Active member of club or organization: Not on file     Attends meetings of clubs or organizations: Not on file     Relationship status: Not on file   ??? Intimate partner violence:      Fear of current or ex partner: Not on file     Emotionally abused: Not on file     Physically abused: Not on file     Forced sexual activity: Not on file   Other Topics Concern   ??? Not on file   Social History Narrative   ??? Not on file         ALLERGIES: Alendronate; Aleve [naproxen sodium]; Duloxetine; Erythromycin; Methylphenidate; Morphine; Sulfa (sulfonamide antibiotics); and Vancomycin    Review of Systems   Constitutional: Negative for chills and fever.   HENT: Negative for congestion.    Eyes: Negative for visual disturbance.   Respiratory: Negative for shortness of breath.    Cardiovascular: Negative for chest pain.   Gastrointestinal: Positive for abdominal pain. Negative for nausea and vomiting.   Genitourinary: Negative for dysuria and  hematuria.   Musculoskeletal: Negative for back pain.   Skin: Negative for rash.   Allergic/Immunologic: Negative for immunocompromised state.   Neurological: Negative for syncope, weakness and headaches.   Psychiatric/Behavioral: Negative for confusion.       Vitals:    06/19/18 1201   BP: 192/63   Pulse: 69   Resp: 16   Temp: 98.6 ??F (37 ??C)   SpO2: 95%   Weight: 78 kg (171 lb 15.3 oz)   Height: '4\' 9"'$  (1.448 m)            Physical Exam  Vitals signs and nursing note reviewed.   Constitutional:       General: She is not in acute distress.     Appearance: She is well-developed.   HENT:      Head: Normocephalic and atraumatic.   Eyes:      General: No scleral icterus.  Neck:      Trachea: No tracheal deviation.   Cardiovascular:      Rate and Rhythm: Normal rate and regular rhythm.      Heart sounds: Normal heart sounds.      Comments: Severe hypertension  Pulmonary:      Effort: Pulmonary effort is normal. No respiratory distress.      Breath sounds: Normal breath sounds.   Abdominal:      General: There is no distension.      Palpations: Abdomen is soft.      Tenderness: There is tenderness in the epigastric area.   Musculoskeletal: Normal range of motion.   Skin:      General: Skin is warm and dry.   Neurological:      Mental Status: She is alert and oriented to person, place, and time.      Cranial Nerves: No cranial nerve deficit.          MDM  Number of Diagnoses or Management Options  Diagnosis management comments: 82-year-old female history of recent UTI, presenting to the ED severely hypertensive,, complaining of deep epigastric pain. While she has no history of vascular disease or smoking, concerned about aortic catastrophe. Limited bedside ultrasound shows no obvious aortic aneurysm. The study was limited however for her diagnosis of dissection. We'll pursue CTA of the chest and abdomen to better evaluate her aorta. There is no history of alcohol abuseor gallstones which would make me suspect pancreatitis. We'll obtain EKG and troponin on the off chance this is actually ACS. Blood pressure control with labetalol in-home antihypertensives.       Amount and/or Complexity of Data Reviewed  Clinical lab tests: ordered  Tests in the radiology section of CPT??: ordered        ED EKG interpretation:  Rhythm: normal sinus rhythm; and regular . Rate (approx.): 66; Axis: normal; P wave: normal; QRS interval: normal ; ST/T wave: normal; in  Lead: ALL; Other findings: LVH .   EKG documented by , as interpreted by Theresia Bough, MD, ED MD.    Recent Results (from the past 12 hour(s))   SAMPLES BEING HELD    Collection Time: 06/19/18 12:03 PM   Result Value Ref Range    SAMPLES BEING HELD 1RED, 1BLUE, 1PST, 1LAV     COMMENT        Add-on orders for these samples will be processed based on acceptable specimen integrity and analyte stability, which may vary by analyte.   CBC WITH AUTOMATED DIFF    Collection Time: 06/19/18 12:03 PM   Result  Value Ref Range    WBC 8.1 3.6 - 11.0 K/uL    RBC 3.02 (L) 3.80 - 5.20 M/uL    HGB 9.6 (L) 11.5 - 16.0 g/dL    HCT 28.8 (L) 35.0 - 47.0 %    MCV 95.4 80.0 - 99.0 FL    MCH 31.8 26.0 - 34.0 PG    MCHC 33.3 30.0 - 36.5 g/dL     RDW 13.8 11.5 - 14.5 %    PLATELET 317 150 - 400 K/uL    MPV 9.6 8.9 - 12.9 FL    NRBC 0.0 0 PER 100 WBC    ABSOLUTE NRBC 0.00 0.00 - 0.01 K/uL    NEUTROPHILS 65 32 - 75 %    LYMPHOCYTES 20 12 - 49 %    MONOCYTES 10 5 - 13 %    EOSINOPHILS 4 0 - 7 %    BASOPHILS 1 0 - 1 %    IMMATURE GRANULOCYTES 0 0.0 - 0.5 %    ABS. NEUTROPHILS 5.3 1.8 - 8.0 K/UL    ABS. LYMPHOCYTES 1.6 0.8 - 3.5 K/UL    ABS. MONOCYTES 0.8 0.0 - 1.0 K/UL    ABS. EOSINOPHILS 0.3 0.0 - 0.4 K/UL    ABS. BASOPHILS 0.0 0.0 - 0.1 K/UL    ABS. IMM. GRANS. 0.0 0.00 - 0.04 K/UL    DF AUTOMATED     LIPASE    Collection Time: 06/19/18 12:03 PM   Result Value Ref Range    Lipase 166 73 - 789 U/L   METABOLIC PANEL, COMPREHENSIVE    Collection Time: 06/19/18 12:03 PM   Result Value Ref Range    Sodium 128 (L) 136 - 145 mmol/L    Potassium 4.3 3.5 - 5.1 mmol/L    Chloride 92 (L) 97 - 108 mmol/L    CO2 25 21 - 32 mmol/L    Anion gap 11 5 - 15 mmol/L    Glucose 88 65 - 100 mg/dL    BUN 10 6 - 20 MG/DL    Creatinine 1.02 0.55 - 1.02 MG/DL    BUN/Creatinine ratio 10 (L) 12 - 20      GFR est AA >60 >60 ml/min/1.40m    GFR est non-AA 52 (L) >60 ml/min/1.766m   Calcium 9.0 8.5 - 10.1 MG/DL    Bilirubin, total 0.3 0.2 - 1.0 MG/DL    ALT (SGPT) 19 12 - 78 U/L    AST (SGOT) 9 (L) 15 - 37 U/L    Alk. phosphatase 116 45 - 117 U/L    Protein, total 6.8 6.4 - 8.2 g/dL    Albumin 3.2 (L) 3.5 - 5.0 g/dL    Globulin 3.6 2.0 - 4.0 g/dL    A-G Ratio 0.9 (L) 1.1 - 2.2     TROPONIN I    Collection Time: 06/19/18 12:03 PM   Result Value Ref Range    Troponin-I, Qt. <0.05 <0.05 ng/mL   URINALYSIS W/ REFLEX CULTURE    Collection Time: 06/19/18 12:23 PM   Result Value Ref Range    Color YELLOW/STRAW      Appearance CLEAR CLEAR      Specific gravity 1.010 1.003 - 1.030      pH (UA) 6.0 5.0 - 8.0      Protein NEGATIVE  NEG mg/dL    Glucose NEGATIVE  NEG mg/dL    Ketone NEGATIVE  NEG mg/dL    Bilirubin NEGATIVE  NEG  Blood NEGATIVE  NEG      Urobilinogen 0.2 0.2 - 1.0 EU/dL     Nitrites NEGATIVE  NEG      Leukocyte Esterase NEGATIVE  NEG      WBC 0-4 0 - 4 /hpf    RBC 0-5 0 - 5 /hpf    Epithelial cells FEW FEW /lpf    Bacteria NEGATIVE  NEG /hpf    UA:UC IF INDICATED CULTURE NOT INDICATED BY UA RESULT     EKG, 12 LEAD, INITIAL    Collection Time: 06/19/18 12:47 PM   Result Value Ref Range    Ventricular Rate 66 BPM    Atrial Rate 66 BPM    P-R Interval 154 ms    QRS Duration 88 ms    Q-T Interval 404 ms    QTC Calculation (Bezet) 423 ms    Calculated P Axis 71 degrees    Calculated R Axis -25 degrees    Calculated T Axis 58 degrees    Diagnosis       Normal sinus rhythm  Voltage criteria for left ventricular hypertrophy  Abnormal ECG  No previous ECGs available     LACTIC ACID    Collection Time: 06/19/18  1:08 PM   Result Value Ref Range    Lactic acid 0.9 0.4 - 2.0 MMOL/L       Procedures    2:00 PM  Awaiting results of CTAs.  BP improved after labetolol.  BP 194/57    Pulse 63    Temp 98.6 ??F (37 ??C)    Resp 16    Ht _0  (1.448 m)    Wt 78 kg (171 lb 15.3 oz)    SpO2 94%    BMI 37.21 kg/m??   Labs notable for hyponatremia.  If imaging negative and patient continues to feel improved, will discharge back to SNF with communication to monitor sodium as this can exacerbate memory issues/dementia.    CT Results  (Last 48 hours)               06/19/18 1324  CTA CHEST W OR W WO CONT Final result    Impression:  IMPRESSION:    1. Pericholecystic inflammatory stranding and equivocal gallbladder wall   thickening. Findings likely represent acute cholecystitis. Ultrasound can be   performed for further evaluation, as indicated.   2. No aortic dissection.   3. Age indeterminate T7, T11, L1, L2 compression fractures.               Narrative:  INDICATION: Urinary tract infection. Generalized abdominal pain. Back pain.       CT arteriogram of the chest, abdomen and pelvis is performed with 2.5 mm   collimation. Study is performed with 100 cc of nonionic IV Isovue-370. 3-D post    processed images were performed.       CT dose reduction was achieved with the use of the standardized protocol   tailored for this examination and automatic exposure control for dose   modulation.       There is no prior study for direct comparison.       Chest:       CTA: The thoracic and abdominal aorta is normal in course and caliber and there   is no aortic dissection. Atherosclerotic calcifications noted within the   thoracic and abdominal aorta. Proximal celiac and SMA are patent. There is a   single left renal artery and a single right renal artery, both  of which are   patent.       Lungs: There is minimal bibasilar dependent atelectasis.       Lymph nodes: There is no mediastinal, hilar or axillary lymphadenopathy.       Pleura: The pleural spaces are normal.       Heart: The heart is normal in size and there is no pericardial fluid.       Bones: The osseous structures are diffusely demineralized. There are age   indeterminate severe T7, T11 and L1 compression deformities. There is also an   age indeterminate moderate L2 compression deformity.       Abdomen/pelvis:       Liver: The liver is normal.       Spleen: The spleen is normal.       Pancreas: The pancreas is normal.       Adrenals: The adrenals are normal.       Gallbladder: There is pericholecystic inflammatory stranding. Gallbladder does   not appear distended there is equivocal gallbladder wall thickening.       Kidneys: The kidneys are normal.       Bowel: No dilated or thickened loop of large or small bowel is seen. Scattered   colonic diverticulosis is noted.       Appendix: The appendix is normal.       Urinary bladder: Urinary bladder is partially filled and grossly normal.       Bones: No lytic or sclerotic osseous lesion is visualized.       Miscellaneous: There is a moderate-sized hiatal hernia. There is no free   intraperitoneal gas or fluid. There is no focal fluid collection to suggest   abscess. Uterus is absent.            06/19/18 1324  CTA ABDOMEN PELV W CONT Final result    Impression:  IMPRESSION:    1. Pericholecystic inflammatory stranding and equivocal gallbladder wall   thickening. Findings likely represent acute cholecystitis. Ultrasound can be   performed for further evaluation, as indicated.   2. No aortic dissection.   3. Age indeterminate T7, T11, L1, L2 compression fractures.               Narrative:  INDICATION: Urinary tract infection. Generalized abdominal pain. Back pain.       CT arteriogram of the chest, abdomen and pelvis is performed with 2.5 mm   collimation. Study is performed with 100 cc of nonionic IV Isovue-370. 3-D post   processed images were performed.       CT dose reduction was achieved with the use of the standardized protocol   tailored for this examination and automatic exposure control for dose   modulation.       There is no prior study for direct comparison.       Chest:       CTA: The thoracic and abdominal aorta is normal in course and caliber and there   is no aortic dissection. Atherosclerotic calcifications noted within the   thoracic and abdominal aorta. Proximal celiac and SMA are patent. There is a   single left renal artery and a single right renal artery, both of which are   patent.       Lungs: There is minimal bibasilar dependent atelectasis.       Lymph nodes: There is no mediastinal, hilar or axillary lymphadenopathy.       Pleura: The pleural spaces are normal.  Heart: The heart is normal in size and there is no pericardial fluid.       Bones: The osseous structures are diffusely demineralized. There are age   indeterminate severe T7, T11 and L1 compression deformities. There is also an   age indeterminate moderate L2 compression deformity.       Abdomen/pelvis:       Liver: The liver is normal.       Spleen: The spleen is normal.       Pancreas: The pancreas is normal.       Adrenals: The adrenals are normal.        Gallbladder: There is pericholecystic inflammatory stranding. Gallbladder does   not appear distended there is equivocal gallbladder wall thickening.       Kidneys: The kidneys are normal.       Bowel: No dilated or thickened loop of large or small bowel is seen. Scattered   colonic diverticulosis is noted.       Appendix: The appendix is normal.       Urinary bladder: Urinary bladder is partially filled and grossly normal.       Bones: No lytic or sclerotic osseous lesion is visualized.       Miscellaneous: There is a moderate-sized hiatal hernia. There is no free   intraperitoneal gas or fluid. There is no focal fluid collection to suggest   abscess. Uterus is absent.               PFT Results  (Last 48 hours)    None        Echo Results  (Last 48 hours)    None        CXR Results  (Last 48 hours)    None        VENOUS DOPPLER results  (Last 48 hours)    None        2:13 PM  CTA notable for possible cholecystitis.  Recommend [place] to correlate.  Ordered.      3:34 PM  US shows acute cholecystitis.  General Surgery consulted and I spoke with NP Manus Rudd who will discuss case and review images with Attending and advise on admission to General Surgery vs Hospitalist service.      3:43 PM  Per NP Manus Rudd, in review with attending, this could be chronic and not acute.  They would like her sent to [facility] ED where they had do a physical evaluation to help determine disposition.  She may not require admission depending on their determination.    Signed By: Theresia Bough, MD     June 19, 2018

## 2018-06-20 ENCOUNTER — Observation Stay: Admit: 2018-06-20 | Payer: MEDICARE | Primary: Family Medicine

## 2018-06-20 LAB — COMPREHENSIVE METABOLIC PANEL
ALT: 16 U/L (ref 12–78)
AST: 9 U/L — ABNORMAL LOW (ref 15–37)
Albumin/Globulin Ratio: 0.8 — ABNORMAL LOW (ref 1.1–2.2)
Albumin: 3.1 g/dL — ABNORMAL LOW (ref 3.5–5.0)
Alkaline Phosphatase: 101 U/L (ref 45–117)
Anion Gap: 6 mmol/L (ref 5–15)
BUN: 7 MG/DL (ref 6–20)
Bun/Cre Ratio: 8 — ABNORMAL LOW (ref 12–20)
CO2: 26 mmol/L (ref 21–32)
Calcium: 9 MG/DL (ref 8.5–10.1)
Chloride: 101 mmol/L (ref 97–108)
Creatinine: 0.89 MG/DL (ref 0.55–1.02)
EGFR IF NonAfrican American: >60 mL/min/{1.73_m2} (ref >60)
GFR African American: >60 mL/min/{1.73_m2} (ref >60)
Globulin: 3.8 g/dL (ref 2.0–4.0)
Glucose: 94 mg/dL (ref 65–100)
Potassium: 4.2 mmol/L (ref 3.5–5.1)
Sodium: 133 mmol/L — ABNORMAL LOW (ref 136–145)
Total Bilirubin: 0.3 MG/DL (ref 0.2–1.0)
Total Protein: 6.9 g/dL (ref 6.4–8.2)

## 2018-06-20 LAB — CBC WITH AUTO DIFFERENTIAL
Basophils %: 1 % (ref 0–1)
Basophils Absolute: 0 10*3/uL (ref 0.0–0.1)
Eosinophils %: 3 % (ref 0–7)
Eosinophils Absolute: 0.3 10*3/uL (ref 0.0–0.4)
Granulocyte Absolute Count: 0 10*3/uL (ref 0.00–0.04)
Hematocrit: 28.7 % — ABNORMAL LOW (ref 35.0–47.0)
Hemoglobin: 9.2 g/dL — ABNORMAL LOW (ref 11.5–16.0)
Immature Granulocytes: 0 % (ref 0.0–0.5)
Lymphocytes %: 18 % (ref 12–49)
Lymphocytes Absolute: 1.6 10*3/uL (ref 0.8–3.5)
MCH: 31.8 PG (ref 26.0–34.0)
MCHC: 32.1 g/dL (ref 30.0–36.5)
MCV: 99.3 FL — ABNORMAL HIGH (ref 80.0–99.0)
MPV: 9.5 FL (ref 8.9–12.9)
Monocytes %: 8 % (ref 5–13)
Monocytes Absolute: 0.7 10*3/uL (ref 0.0–1.0)
NRBC Absolute: 0 10*3/uL (ref 0.00–0.01)
Neutrophils %: 70 % (ref 32–75)
Neutrophils Absolute: 6.2 10*3/uL (ref 1.8–8.0)
Nucleated RBCs: 0 PER 100 WBC
Platelets: 287 10*3/uL (ref 150–400)
RBC: 2.89 M/uL — ABNORMAL LOW (ref 3.80–5.20)
RDW: 14.1 % (ref 11.5–14.5)
WBC: 8.7 10*3/uL (ref 3.6–11.0)

## 2018-06-20 LAB — PHOSPHORUS
Phosphorus: 3.4 MG/DL (ref 2.6–4.7)
Phosphorus: 3.4 MG/DL (ref 2.6–4.7)

## 2018-06-20 LAB — MAGNESIUM
Magnesium: 2.2 mg/dL (ref 1.6–2.4)
Magnesium: 2.2 mg/dL (ref 1.6–2.4)

## 2018-06-20 LAB — METABOLIC PANEL, COMPREHENSIVE
A-G Ratio: 0.8 — ABNORMAL LOW (ref 1.1–2.2)
ALT (SGPT): 16 U/L (ref 12–78)
AST (SGOT): 9 U/L — ABNORMAL LOW (ref 15–37)
Albumin: 3.1 g/dL — ABNORMAL LOW (ref 3.5–5.0)
Alk. phosphatase: 101 U/L (ref 45–117)
Anion gap: 6 mmol/L (ref 5–15)
BUN/Creatinine ratio: 8 — ABNORMAL LOW (ref 12–20)
BUN: 7 MG/DL (ref 6–20)
Bilirubin, total: 0.3 MG/DL (ref 0.2–1.0)
CO2: 26 mmol/L (ref 21–32)
Calcium: 9 MG/DL (ref 8.5–10.1)
Chloride: 101 mmol/L (ref 97–108)
Creatinine: 0.89 MG/DL (ref 0.55–1.02)
GFR est AA: >60 mL/min/{1.73_m2} (ref >60)
GFR est non-AA: >60 mL/min/{1.73_m2} (ref >60)
Globulin: 3.8 g/dL (ref 2.0–4.0)
Glucose: 94 mg/dL (ref 65–100)
Potassium: 4.2 mmol/L (ref 3.5–5.1)
Protein, total: 6.9 g/dL (ref 6.4–8.2)
Sodium: 133 mmol/L — ABNORMAL LOW (ref 136–145)

## 2018-06-20 LAB — CBC WITH AUTOMATED DIFF
ABS. BASOPHILS: 0 10*3/uL (ref 0.0–0.1)
ABS. EOSINOPHILS: 0.3 10*3/uL (ref 0.0–0.4)
ABS. IMM. GRANS.: 0 10*3/uL (ref 0.00–0.04)
ABS. LYMPHOCYTES: 1.6 10*3/uL (ref 0.8–3.5)
ABS. MONOCYTES: 0.7 10*3/uL (ref 0.0–1.0)
ABS. NEUTROPHILS: 6.2 10*3/uL (ref 1.8–8.0)
ABSOLUTE NRBC: 0 10*3/uL (ref 0.00–0.01)
BASOPHILS: 1 % (ref 0–1)
EOSINOPHILS: 3 % (ref 0–7)
HCT: 28.7 % — ABNORMAL LOW (ref 35.0–47.0)
HGB: 9.2 g/dL — ABNORMAL LOW (ref 11.5–16.0)
IMMATURE GRANULOCYTES: 0 % (ref 0.0–0.5)
LYMPHOCYTES: 18 % (ref 12–49)
MCH: 31.8 PG (ref 26.0–34.0)
MCHC: 32.1 g/dL (ref 30.0–36.5)
MCV: 99.3 FL — ABNORMAL HIGH (ref 80.0–99.0)
MONOCYTES: 8 % (ref 5–13)
MPV: 9.5 FL (ref 8.9–12.9)
NEUTROPHILS: 70 % (ref 32–75)
NRBC: 0 PER 100 WBC
PLATELET: 287 10*3/uL (ref 150–400)
RBC: 2.89 M/uL — ABNORMAL LOW (ref 3.80–5.20)
RDW: 14.1 % (ref 11.5–14.5)
WBC: 8.7 10*3/uL (ref 3.6–11.0)

## 2018-06-20 MED ORDER — TECHNETIUM TC 99M MEBROFENIN
Freq: Once | Status: AC
Start: 2018-06-20 — End: 2018-06-20
  Administered 2018-06-20: 15:00:00 via INTRAVENOUS

## 2018-06-20 MED ORDER — ACETAMINOPHEN 325 MG TABLET
325 mg | Freq: Four times a day (QID) | ORAL | Status: DC | PRN
Start: 2018-06-20 — End: 2018-06-24
  Administered 2018-06-21: 10:00:00 via ORAL

## 2018-06-20 MED ORDER — SODIUM CHLORIDE 0.9 % IJ SYRG
INTRAMUSCULAR | Status: DC | PRN
Start: 2018-06-20 — End: 2018-06-24
  Administered 2018-06-20: 17:00:00 via INTRAVENOUS

## 2018-06-20 MED ORDER — LEVOTHYROXINE 125 MCG TAB
125 mcg | Freq: Every day | ORAL | Status: DC
Start: 2018-06-20 — End: 2018-06-24
  Administered 2018-06-22 – 2018-06-24 (×3): via ORAL

## 2018-06-20 MED ORDER — HYDROMORPHONE (PF) 1 MG/ML IJ SOLN
1 mg/mL | INTRAMUSCULAR | Status: DC | PRN
Start: 2018-06-20 — End: 2018-06-24

## 2018-06-20 MED ORDER — PIPERACILLIN-TAZOBACTAM 3.375 GRAM IV SOLR
3.375 gram | Freq: Three times a day (TID) | INTRAVENOUS | Status: DC
Start: 2018-06-20 — End: 2018-06-24
  Administered 2018-06-20 – 2018-06-24 (×15): via INTRAVENOUS

## 2018-06-20 MED ORDER — LACTATED RINGERS IV
INTRAVENOUS | Status: DC
Start: 2018-06-20 — End: 2018-06-24
  Administered 2018-06-20 – 2018-06-23 (×5): via INTRAVENOUS

## 2018-06-20 MED ORDER — LAMOTRIGINE 100 MG TAB
100 mg | Freq: Every day | ORAL | Status: DC
Start: 2018-06-20 — End: 2018-06-20
  Administered 2018-06-20: 14:00:00 via ORAL

## 2018-06-20 MED ORDER — FENTANYL CITRATE (PF) 50 MCG/ML IJ SOLN
50 mcg/mL | INTRAMUSCULAR | Status: DC | PRN
Start: 2018-06-20 — End: 2018-06-19

## 2018-06-20 MED ORDER — HYDRALAZINE 20 MG/ML IJ SOLN
20 mg/mL | Freq: Four times a day (QID) | INTRAMUSCULAR | Status: DC | PRN
Start: 2018-06-20 — End: 2018-06-24

## 2018-06-20 MED ORDER — ARIPIPRAZOLE 5 MG TAB
5 mg | Freq: Every day | ORAL | Status: DC
Start: 2018-06-20 — End: 2018-06-24
  Administered 2018-06-20 – 2018-06-24 (×5): via ORAL

## 2018-06-20 MED ORDER — MORPHINE 2 MG/ML INJECTION
2 mg/mL | Freq: Once | INTRAMUSCULAR | Status: AC
Start: 2018-06-20 — End: 2018-06-20
  Administered 2018-06-20: 17:00:00 via INTRAVENOUS

## 2018-06-20 MED ORDER — DIPHENHYDRAMINE HCL 50 MG/ML IJ SOLN
50 mg/mL | Freq: Four times a day (QID) | INTRAMUSCULAR | Status: DC | PRN
Start: 2018-06-20 — End: 2018-06-24
  Administered 2018-06-20: 17:00:00 via INTRAVENOUS

## 2018-06-20 MED ORDER — LAMOTRIGINE 100 MG TAB
100 mg | Freq: Every day | ORAL | Status: DC
Start: 2018-06-20 — End: 2018-06-24
  Administered 2018-06-20 – 2018-06-24 (×5): via ORAL

## 2018-06-20 MED ORDER — ONDANSETRON (PF) 4 MG/2 ML INJECTION
4 mg/2 mL | INTRAMUSCULAR | Status: DC | PRN
Start: 2018-06-20 — End: 2018-06-24

## 2018-06-20 MED ORDER — PHENELZINE 15 MG TAB
15 mg | Freq: Three times a day (TID) | ORAL | Status: DC
Start: 2018-06-20 — End: 2018-06-24
  Administered 2018-06-20 – 2018-06-24 (×14): via ORAL

## 2018-06-20 MED ORDER — NALOXONE 0.4 MG/ML INJECTION
0.4 mg/mL | INTRAMUSCULAR | Status: DC | PRN
Start: 2018-06-20 — End: 2018-06-24

## 2018-06-20 MED ORDER — SODIUM CHLORIDE 0.9 % IJ SYRG
Freq: Three times a day (TID) | INTRAMUSCULAR | Status: DC
Start: 2018-06-20 — End: 2018-06-24
  Administered 2018-06-20 – 2018-06-24 (×13): via INTRAVENOUS

## 2018-06-20 MED ORDER — LEVOTHYROXINE 125 MCG TAB
125 mcg | Freq: Every day | ORAL | Status: DC
Start: 2018-06-20 — End: 2018-06-20
  Administered 2018-06-20: 12:00:00 via ORAL

## 2018-06-20 MED FILL — MORPHINE 2 MG/ML INJECTION: 2 mg/mL | INTRAMUSCULAR | Qty: 1

## 2018-06-20 MED FILL — MONOJECT PREFILL ADVANCED 0.9 % SODIUM CHLORIDE INJECTION SYRINGE: INTRAMUSCULAR | Qty: 40

## 2018-06-20 MED FILL — NARDIL 15 MG TABLET: 15 mg | ORAL | Qty: 1

## 2018-06-20 MED FILL — PIPERACILLIN-TAZOBACTAM 3.375 GRAM IV SOLR: 3.375 gram | INTRAVENOUS | Qty: 3.38

## 2018-06-20 MED FILL — LACTATED RINGERS IV: INTRAVENOUS | Qty: 1000

## 2018-06-20 MED FILL — LAMOTRIGINE 100 MG TAB: 100 mg | ORAL | Qty: 2

## 2018-06-20 MED FILL — ARIPIPRAZOLE 5 MG TAB: 5 mg | ORAL | Qty: 1

## 2018-06-20 MED FILL — DIPHENHYDRAMINE HCL 50 MG/ML IJ SOLN: 50 mg/mL | INTRAMUSCULAR | Qty: 1

## 2018-06-20 MED FILL — NORMAL SALINE FLUSH 0.9 % INJECTION SYRINGE: INTRAMUSCULAR | Qty: 10

## 2018-06-20 MED FILL — SYNTHROID 125 MCG TABLET: 125 mcg | ORAL | Qty: 1

## 2018-06-20 NOTE — Progress Notes (Signed)
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 70 16 154/43 94 %   06/20/18 0900 ??? 71 15 182/47 94 %   06/20/18 0800 ??? 72 13 177/45 96 %   06/20/18 0600 ??? 73 19 (!) 145/39 93 %   06/20/18 0500 ??? 75 19 156/45 95 %   06/20/18 0400 ??? 78 17 154/53 92 %   06/20/18 0300 ??? 71 18 148/44 94 %   06/20/18 0200 ??? 71 20 146/45 93 %   06/20/18 0100 ??? 78 19 (!) 173/30 95 %   06/19/18 2300 ??? 69 21 184/47 94 %   06/19/18 2200 ??? 68 20 170/42 95 %   06/19/18 2100 ??? 66 15 165/51 95 %   06/19/18 2000 ??? 64 16 182/45 95 %   06/19/18 1958 97.5 ??F (36.4 ??C) ??? ??? ??? ???   06/19/18 1930 ??? 65 13 167/47 95 %   06/19/18 1900 ??? 64 11 (!) 162/38 97 %   06/19/18 1721 97.7 ??F (36.5 ??C) ??? ??? ??? ???   06/19/18 1719 ??? 67 16 119/56 97 %   06/19/18 1700 ??? 64 15 140/62 96 %   06/19/18 1615 ??? 63 17 155/64 95 %   06/19/18 1600 ??? 65 13 144/53 96 %   06/19/18 1530 ??? 67 15 174/46 97 %   06/19/18 1445 ??? 64 12 142/47 96 %   06/19/18 1430 ??? 66 18 175/57 97 %   06/19/18 1428 ??? 72 18 154/55 ???   06/19/18 1400 ??? 63 11 (!) 201/53 97 %   06/19/18 1345 ??? 63 16 194/57 94 %       Date 06/19/18 0700 - 06/20/18 0659 06/20/18 0700 - 06/21/18 0659   Shift 0700-1859 1900-0659 24 Hour Total 0700-1859 1900-0659 24 Hour Total   INTAKE   I.V.(mL/kg/hr)  100(0.1) 100(0.1)        Volume (piperacillin-tazobactam (ZOSYN) 3.375 g in 0.9% sodium chloride (MBP/ADV) 100 mL)  100 100      Shift Total(mL/kg)  100(1.3) 100(1.3)      OUTPUT   Urine(mL/kg/hr)    450  450     Urine Voided    450  450   Shift Total(mL/kg)    450(5.8)  450(5.8)   NET  100 100 -450  -450   Weight (kg) 78 78 78 78 78 78       PE  Pulm - CTAB  CV - RRR  Abd - soft, ND BS Present, mild TTP RUQ, more TTP in LLQ    Labs  Recent Results (from the past 12 hour(s))    METABOLIC PANEL, COMPREHENSIVE    Collection Time: 06/20/18  4:37 AM   Result Value Ref Range    Sodium 133 (L) 136 - 145 mmol/L    Potassium 4.2 3.5 - 5.1 mmol/L    Chloride 101 97 - 108 mmol/L    CO2 26 21 - 32 mmol/L    Anion gap 6 5 - 15 mmol/L    Glucose 94 65 - 100 mg/dL    BUN 7 6 - 20 MG/DL    Creatinine 0.89 0.55 - 1.02 MG/DL    BUN/Creatinine ratio  8 (L) 12 - 20      GFR est AA >60 >60 ml/min/1.64m    GFR est non-AA >60 >60 ml/min/1.760m   Calcium 9.0 8.5 - 10.1 MG/DL    Bilirubin, total 0.3 0.2 - 1.0 MG/DL    ALT (SGPT) 16 12 - 78 U/L    AST (SGOT) 9 (L) 15 - 37 U/L    Alk. phosphatase 101 45 - 117 U/L    Protein, total 6.9 6.4 - 8.2 g/dL    Albumin 3.1 (L) 3.5 - 5.0 g/dL    Globulin 3.8 2.0 - 4.0 g/dL    A-G Ratio 0.8 (L) 1.1 - 2.2     MAGNESIUM    Collection Time: 06/20/18  4:37 AM   Result Value Ref Range    Magnesium 2.2 1.6 - 2.4 mg/dL   PHOSPHORUS    Collection Time: 06/20/18  4:37 AM   Result Value Ref Range    Phosphorus 3.4 2.6 - 4.7 MG/DL   CBC WITH AUTOMATED DIFF    Collection Time: 06/20/18  4:37 AM   Result Value Ref Range    WBC 8.7 3.6 - 11.0 K/uL    RBC 2.89 (L) 3.80 - 5.20 M/uL    HGB 9.2 (L) 11.5 - 16.0 g/dL    HCT 28.7 (L) 35.0 - 47.0 %    MCV 99.3 (H) 80.0 - 99.0 FL    MCH 31.8 26.0 - 34.0 PG    MCHC 32.1 30.0 - 36.5 g/dL    RDW 14.1 11.5 - 14.5 %    PLATELET 287 150 - 400 K/uL    MPV 9.5 8.9 - 12.9 FL    NRBC 0.0 0 PER 100 WBC    ABSOLUTE NRBC 0.00 0.00 - 0.01 K/uL    NEUTROPHILS 70 32 - 75 %    LYMPHOCYTES 18 12 - 49 %    MONOCYTES 8 5 - 13 %    EOSINOPHILS 3 0 - 7 %    BASOPHILS 1 0 - 1 %    IMMATURE GRANULOCYTES 0 0.0 - 0.5 %    ABS. NEUTROPHILS 6.2 1.8 - 8.0 K/UL    ABS. LYMPHOCYTES 1.6 0.8 - 3.5 K/UL    ABS. MONOCYTES 0.7 0.0 - 1.0 K/UL    ABS. EOSINOPHILS 0.3 0.0 - 0.4 K/UL    ABS. BASOPHILS 0.0 0.0 - 0.1 K/UL    ABS. IMM. GRANS. 0.0 0.00 - 0.04 K/UL    DF AUTOMATED           Assessment     ViShayra Antons a [age].o.yr old female with possible cholecystitis and LLQ  pain    Plan     The HIDA scan is pending still  Continue on IVF abx  CT and USKoreaeen to indicate cholecystitis but her WBC is normal and having more pain in the LLQ  CT does not show obvious cause of the LLQ pain, she does have some occasional constipation but does not seen severe and has had some BM in the past day  Advanced diet to clears for now    NaDebbe BalesMD

## 2018-06-20 NOTE — ED Notes (Signed)
 TRANSFER - OUT REPORT:    Verbal report given to Therisa, Charity fundraiser (name) on Kellie  Patterson  being transferred to 5E, RM 509 (unit) for routine progression of care       Report consisted of patient's Situation, Background, Assessment and   Recommendations(SBAR).     Information from the following report(s) SBAR, ED Summary and MAR was reviewed with the receiving nurse.    Lines:   Peripheral IV 06/19/18 Left Wrist (Active)   Site Assessment Clean, dry, & intact 06/19/2018 12:02 PM   Dressing Status Clean, dry, & intact 06/19/2018 12:02 PM       Peripheral IV 06/20/18 Right Hand (Active)   Site Assessment Clean, dry, & intact 06/20/2018 12:52 PM   Phlebitis Assessment 0 06/20/2018 12:52 PM   Infiltration Assessment 0 06/20/2018 12:52 PM   Dressing Status Clean, dry, & intact 06/20/2018 12:52 PM        Opportunity for questions and clarification was provided.      Patient transported with:   [employer]

## 2018-06-20 NOTE — Progress Notes (Signed)
Admission Medication Reconciliation:    Information obtained from: PTA medication list updated with [facility]12[facility]19.    Summary:     Medications added: none  Medications deleted: none  Doses changed: lamotrigine 200 mg daily (vs 100 mg daily), levothyroxine 62.5 mcg daily (vs 125 mcg daily)    Inpatient orders have been reviewed and Dr. Amada JupiterShindel has been contacted regarding changes to the PTA medication record.     Chief Complaint for this Admission:  cholecystitis     Significant PMH/Disease States:   Past Medical History:   Diagnosis Date    Chronic back pain     Cognitive impairment     Depression     Hypothyroidism     Osteoporosis     Scoliosis        Allergies:  Alendronate; Aleve [naproxen sodium]; Duloxetine; Erythromycin; Methylphenidate; Morphine; Sulfa (sulfonamide antibiotics); and Vancomycin    Prior to Admission Medications:   Prior to Admission Medications   Prescriptions Last Dose Informant Patient Reported? Taking?   ARIPiprazole (ABILIFY) 15 mg tablet 06/19/2018  Yes Yes   Sig: Take 15 mg by mouth daily.   Omega-3 Fatty Acids (FISH OIL) 500 mg cap 06/19/2018  Yes Yes   Sig: Take 1,000 mg by mouth daily.   acetaminophen (TYLENOL) 500 mg tablet 06/19/2018  Yes Yes   Sig: Take 1,000 mg by mouth every six (6) hours as needed for Pain.   calcium carbonate/vitamin D3 (CALCIUM + D PO) 06/18/2018  Yes Yes   Sig: Take 1 Tab by mouth every evening. (calcium 600 mg + vitamin D 800 units)   cephALEXin (KEFLEX) 500 mg capsule 06/19/2018  Yes Yes   Sig: Take 500 mg by mouth three (3) times daily. (started on 06-16-18 x 5 days)   cholecalciferol (VITAMIN D3) (1000 Units /25 mcg) tablet 06/19/2018  Yes Yes   Sig: Take 1,000 mg by mouth daily.   ferrous sulfate 325 mg (65 mg iron) tablet 06/18/2018  Yes Yes   Sig: Take 325 mg by mouth daily (with dinner).   lamoTRIgine (LAMICTAL) 100 mg tablet 06/19/2018  Yes Yes   Sig: Take 200 mg by mouth daily.   levothyroxine  (SYNTHROID) 125 mcg tablet 06/19/2018  Yes Yes   Sig: Take 62.5 mcg by mouth Daily (before breakfast). (dose = 0.5 x 125 mcg tablet)   meloxicam (MOBIC) 7.5 mg tablet 06/19/2018  Yes Yes   Sig: Take 7.5 mg by mouth daily.   multivitamin,tx-iron-ca-min (THERA-M) 27-0.4 mg tab 06/19/2018  Yes Yes   Sig: Take 1 Tab by mouth daily.   phenelzine (NARDIL) 15 mg tablet 06/19/2018  Yes Yes   Sig: Take 15 mg by mouth three (3) times daily as needed.   senna (SENEXON) 8.6 mg tablet   Yes Yes   Sig: Take 1 Tab by mouth nightly as needed for Constipation.      Facility-Administered Medications: None         Thank you for allowing me to participate in the care of this patient.  Please contact the pharmacy at [phone] or the medication reconciliation pharmacist at [phone] with any questions.    Everette RankLisa Leonard, Pharm.D., BCPS, BCPPS

## 2018-06-20 NOTE — Progress Notes (Signed)
Bedside and Verbal shift change report given to Vernia BuffMarc, RN (oncoming nurse) by Dwana MelenaKatherine Dabney, RN (offgoing nurse). Report included the following information SBAR, Kardex, Intake/Output, MAR, Accordion, Recent Results and Med Rec Status.

## 2018-06-20 NOTE — ED Notes (Signed)
[id number]- Shift uneventful. Patient turned, cleaned, changed. VSS, in NAD.

## 2018-06-20 NOTE — Progress Notes (Signed)
Bedside shift change report given to Katie, RN (oncoming nurse) by Anna, RN (offgoing nurse). Report included the following information SBAR and Kardex.

## 2018-06-20 NOTE — Progress Notes (Addendum)
Bedside and Verbal shift change report given to Marc, RN (oncoming nurse) by Katherine Dabney, RN (offgoing nurse). Report included the following information SBAR, Kardex, Intake/Output, MAR, Accordion, Recent Results and Med Rec Status.

## 2018-06-20 NOTE — Progress Notes (Signed)
Admission Medication Reconciliation:    Information obtained from: PTA medication list [facility].    Summary:     Medications added: none  Medications deleted: none  Doses changed: lamotrigine 200 mg daily (vs 100 mg daily), levothyroxine 62.5 mcg daily (vs 125 mcg daily)    Inpatient orders have been reviewed and Dr. Amada JupiterShindel has been contacted regarding changes to the PTA medication record.     Chief Complaint for this Admission:  cholecystitis     Significant PMH/Disease States:   Past Medical History:   Diagnosis Date    Chronic back pain     Cognitive impairment     Depression     Hypothyroidism     Osteoporosis     Scoliosis        Allergies:  Alendronate; Aleve [naproxen sodium]; Duloxetine; Erythromycin; Methylphenidate; Morphine; Sulfa (sulfonamide antibiotics); and Vancomycin    Prior to Admission Medications:   Prior to Admission Medications   Prescriptions Last Dose Informant Patient Reported? Taking?   ARIPiprazole (ABILIFY) 15 mg tablet 06/19/2018  Yes Yes   Sig: Take 15 mg by mouth daily.   Omega-3 Fatty Acids (FISH OIL) 500 mg cap 06/19/2018  Yes Yes   Sig: Take 1,000 mg by mouth daily.   acetaminophen (TYLENOL) 500 mg tablet 06/19/2018  Yes Yes   Sig: Take 1,000 mg by mouth every six (6) hours as needed for Pain.   calcium carbonate/vitamin D3 (CALCIUM + D PO) 06/18/2018  Yes Yes   Sig: Take 1 Tab by mouth every evening. (calcium 600 mg + vitamin D 800 units)   cephALEXin (KEFLEX) 500 mg capsule 06/19/2018  Yes Yes   Sig: Take 500 mg by mouth three (3) times daily. (started on 06-16-18 x 5 days)   cholecalciferol (VITAMIN D3) (1000 Units /25 mcg) tablet 06/19/2018  Yes Yes   Sig: Take 1,000 mg by mouth daily.   ferrous sulfate 325 mg (65 mg iron) tablet 06/18/2018  Yes Yes   Sig: Take 325 mg by mouth daily (with dinner).   lamoTRIgine (LAMICTAL) 100 mg tablet 06/19/2018  Yes Yes   Sig: Take 200 mg by mouth daily.    levothyroxine (SYNTHROID) 125 mcg tablet 06/19/2018  Yes Yes   Sig: Take 62.5 mcg by mouth Daily (before breakfast). (dose = 0.5 x 125 mcg tablet)   meloxicam (MOBIC) 7.5 mg tablet 06/19/2018  Yes Yes   Sig: Take 7.5 mg by mouth daily.   multivitamin,tx-iron-ca-min (THERA-M) 27-0.4 mg tab 06/19/2018  Yes Yes   Sig: Take 1 Tab by mouth daily.   phenelzine (NARDIL) 15 mg tablet 06/19/2018  Yes Yes   Sig: Take 15 mg by mouth three (3) times daily as needed.   senna (SENEXON) 8.6 mg tablet   Yes Yes   Sig: Take 1 Tab by mouth nightly as needed for Constipation.      Facility-Administered Medications: None         Thank you for allowing me to participate in the care of this patient.  Please contact the pharmacy at [phone] or the medication reconciliation pharmacist at [phone] with any questions.    Everette RankLisa Savvy Peeters, Pharm.D., BCPS, BCPPS

## 2018-06-20 NOTE — Progress Notes (Signed)
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 70 16 154/43 94 %   06/20/18 0900 ??? 71 15 182/47 94 %   06/20/18 0800 ??? 72 13 177/45 96 %   06/20/18 0600 ??? 73 19 (!) 145/39 93 %   06/20/18 0500 ??? 75 19 156/45 95 %   06/20/18 0400 ??? 78 17 154/53 92 %   06/20/18 0300 ??? 71 18 148/44 94 %   06/20/18 0200 ??? 71 20 146/45 93 %   06/20/18 0100 ??? 78 19 (!) 173/30 95 %   06/19/18 2300 ??? 69 21 184/47 94 %   06/19/18 2200 ??? 68 20 170/42 95 %   06/19/18 2100 ??? 66 15 165/51 95 %   06/19/18 2000 ??? 64 16 182/45 95 %   06/19/18 1958 97.5 ??F (36.4 ??C) ??? ??? ??? ???   06/19/18 1930 ??? 65 13 167/47 95 %   06/19/18 1900 ??? 64 11 (!) 162/38 97 %   06/19/18 1721 97.7 ??F (36.5 ??C) ??? ??? ??? ???   06/19/18 1719 ??? 67 16 119/56 97 %   06/19/18 1700 ??? 64 15 140/62 96 %   06/19/18 1615 ??? 63 17 155/64 95 %   06/19/18 1600 ??? 65 13 144/53 96 %   06/19/18 1530 ??? 67 15 174/46 97 %   06/19/18 1445 ??? 64 12 142/47 96 %   06/19/18 1430 ??? 66 18 175/57 97 %   06/19/18 1428 ??? 72 18 154/55 ???   06/19/18 1400 ??? 63 11 (!) 201/53 97 %   06/19/18 1345 ??? 63 16 194/57 94 %       Date 06/19/18 0700 - 06/20/18 0659 06/20/18 0700 - 06/21/18 0659   Shift 0700-1859 1900-0659 24 Hour Total 0700-1859 1900-0659 24 Hour Total   INTAKE   I.V.(mL/kg/hr)  100(0.1) 100(0.1)        Volume (piperacillin-tazobactam (ZOSYN) 3.375 g in 0.9% sodium chloride (MBP/ADV) 100 mL)  100 100      Shift Total(mL/kg)  100(1.3) 100(1.3)      OUTPUT   Urine(mL/kg/hr)    450  450     Urine Voided    450  450   Shift Total(mL/kg)    450(5.8)  450(5.8)   NET  100 100 -450  -450   Weight (kg) 78 78 78 78 78 78       PE  Pulm - CTAB  CV - RRR  Abd - soft, ND BS Present, mild TTP RUQ, more TTP in LLQ    Labs  Recent Results (from the past 12 hour(s))    METABOLIC PANEL, COMPREHENSIVE    Collection Time: 06/20/18  4:37 AM   Result Value Ref Range    Sodium 133 (L) 136 - 145 mmol/L    Potassium 4.2 3.5 - 5.1 mmol/L    Chloride 101 97 - 108 mmol/L    CO2 26 21 - 32 mmol/L    Anion gap 6 5 - 15 mmol/L    Glucose 94 65 - 100 mg/dL    BUN 7 6 - 20 MG/DL    Creatinine 0.89 0.55 - 1.02 MG/DL    BUN/Creatinine ratio  8 (L) 12 - 20      GFR est AA >60 >60 ml/min/1.40m    GFR est non-AA >60 >60 ml/min/1.740m   Calcium 9.0 8.5 - 10.1 MG/DL    Bilirubin, total 0.3 0.2 - 1.0 MG/DL    ALT (SGPT) 16 12 - 78 U/L    AST (SGOT) 9 (L) 15 - 37 U/L    Alk. phosphatase 101 45 - 117 U/L    Protein, total 6.9 6.4 - 8.2 g/dL    Albumin 3.1 (L) 3.5 - 5.0 g/dL    Globulin 3.8 2.0 - 4.0 g/dL    A-G Ratio 0.8 (L) 1.1 - 2.2     MAGNESIUM    Collection Time: 06/20/18  4:37 AM   Result Value Ref Range    Magnesium 2.2 1.6 - 2.4 mg/dL   PHOSPHORUS    Collection Time: 06/20/18  4:37 AM   Result Value Ref Range    Phosphorus 3.4 2.6 - 4.7 MG/DL   CBC WITH AUTOMATED DIFF    Collection Time: 06/20/18  4:37 AM   Result Value Ref Range    WBC 8.7 3.6 - 11.0 K/uL    RBC 2.89 (L) 3.80 - 5.20 M/uL    HGB 9.2 (L) 11.5 - 16.0 g/dL    HCT 28.7 (L) 35.0 - 47.0 %    MCV 99.3 (H) 80.0 - 99.0 FL    MCH 31.8 26.0 - 34.0 PG    MCHC 32.1 30.0 - 36.5 g/dL    RDW 14.1 11.5 - 14.5 %    PLATELET 287 150 - 400 K/uL    MPV 9.5 8.9 - 12.9 FL    NRBC 0.0 0 PER 100 WBC    ABSOLUTE NRBC 0.00 0.00 - 0.01 K/uL    NEUTROPHILS 70 32 - 75 %    LYMPHOCYTES 18 12 - 49 %    MONOCYTES 8 5 - 13 %    EOSINOPHILS 3 0 - 7 %    BASOPHILS 1 0 - 1 %    IMMATURE GRANULOCYTES 0 0.0 - 0.5 %    ABS. NEUTROPHILS 6.2 1.8 - 8.0 K/UL    ABS. LYMPHOCYTES 1.6 0.8 - 3.5 K/UL    ABS. MONOCYTES 0.7 0.0 - 1.0 K/UL    ABS. EOSINOPHILS 0.3 0.0 - 0.4 K/UL    ABS. BASOPHILS 0.0 0.0 - 0.1 K/UL    ABS. IMM. GRANS. 0.0 0.00 - 0.04 K/UL    DF AUTOMATED           Assessment     Kellie Patterson a [age].o.yr old female with possible cholecystitis and LLQ pain     Plan     The HIDA scan is pending still  Continue on IVF abx  CT and USKoreaeen to indicate cholecystitis but her WBC is normal and having more pain in the LLQ  CT does not show obvious cause of the LLQ pain, she does have some occasional constipation but does not seen severe and has had some BM in the past day  Advanced diet to clears for now    NaDebbe BalesMD

## 2018-06-20 NOTE — ED Notes (Signed)
TRANSFER - OUT REPORT:    Verbal report given to Anna, RN (name) on Reianna Metsker  being transferred to 5E, RM 509 (unit) for routine progression of care       Report consisted of patient???s Situation, Background, Assessment and   Recommendations(SBAR).     Information from the following report(s) SBAR, ED Summary and MAR was reviewed with the receiving nurse.    Lines:   Peripheral IV 06/19/18 Left Wrist (Active)   Site Assessment Clean, dry, & intact 06/19/2018 12:02 PM   Dressing Status Clean, dry, & intact 06/19/2018 12:02 PM       Peripheral IV 06/20/18 Right Hand (Active)   Site Assessment Clean, dry, & intact 06/20/2018 12:52 PM   Phlebitis Assessment 0 06/20/2018 12:52 PM   Infiltration Assessment 0 06/20/2018 12:52 PM   Dressing Status Clean, dry, & intact 06/20/2018 12:52 PM        Opportunity for questions and clarification was provided.      Patient transported with:   [employer]

## 2018-06-20 NOTE — ED Notes (Signed)
0717- Shift uneventful. Patient turned, cleaned, changed. VSS, in NAD.

## 2018-06-21 LAB — COMPREHENSIVE METABOLIC PANEL
ALT: 14 U/L (ref 12–78)
AST: 9 U/L — ABNORMAL LOW (ref 15–37)
Albumin/Globulin Ratio: 0.8 — ABNORMAL LOW (ref 1.1–2.2)
Albumin: 2.7 g/dL — ABNORMAL LOW (ref 3.5–5.0)
Alkaline Phosphatase: 79 U/L (ref 45–117)
Anion Gap: 8 mmol/L (ref 5–15)
BUN: 7 MG/DL (ref 6–20)
Bun/Cre Ratio: 8 — ABNORMAL LOW (ref 12–20)
CO2: 25 mmol/L (ref 21–32)
Calcium: 9 MG/DL (ref 8.5–10.1)
Chloride: 101 mmol/L (ref 97–108)
Creatinine: 0.91 MG/DL (ref 0.55–1.02)
EGFR IF NonAfrican American: 59 mL/min/{1.73_m2} — ABNORMAL LOW (ref >60)
GFR African American: >60 mL/min/{1.73_m2} (ref >60)
Globulin: 3.3 g/dL (ref 2.0–4.0)
Glucose: 77 mg/dL (ref 65–100)
Potassium: 4.4 mmol/L (ref 3.5–5.1)
Sodium: 134 mmol/L — ABNORMAL LOW (ref 136–145)
Total Bilirubin: 0.3 MG/DL (ref 0.2–1.0)
Total Protein: 6 g/dL — ABNORMAL LOW (ref 6.4–8.2)

## 2018-06-21 LAB — PHOSPHORUS
Phosphorus: 3.5 MG/DL (ref 2.6–4.7)
Phosphorus: 3.5 MG/DL (ref 2.6–4.7)

## 2018-06-21 LAB — MAGNESIUM
Magnesium: 2.1 mg/dL (ref 1.6–2.4)
Magnesium: 2.1 mg/dL (ref 1.6–2.4)

## 2018-06-21 LAB — METABOLIC PANEL, COMPREHENSIVE
A-G Ratio: 0.8 — ABNORMAL LOW (ref 1.1–2.2)
ALT (SGPT): 14 U/L (ref 12–78)
AST (SGOT): 9 U/L — ABNORMAL LOW (ref 15–37)
Albumin: 2.7 g/dL — ABNORMAL LOW (ref 3.5–5.0)
Alk. phosphatase: 79 U/L (ref 45–117)
Anion gap: 8 mmol/L (ref 5–15)
BUN/Creatinine ratio: 8 — ABNORMAL LOW (ref 12–20)
BUN: 7 MG/DL (ref 6–20)
Bilirubin, total: 0.3 MG/DL (ref 0.2–1.0)
CO2: 25 mmol/L (ref 21–32)
Calcium: 9 MG/DL (ref 8.5–10.1)
Chloride: 101 mmol/L (ref 97–108)
Creatinine: 0.91 MG/DL (ref 0.55–1.02)
GFR est AA: >60 mL/min/{1.73_m2} (ref >60)
GFR est non-AA: 59 mL/min/{1.73_m2} — ABNORMAL LOW (ref >60)
Globulin: 3.3 g/dL (ref 2.0–4.0)
Glucose: 77 mg/dL (ref 65–100)
Potassium: 4.4 mmol/L (ref 3.5–5.1)
Protein, total: 6 g/dL — ABNORMAL LOW (ref 6.4–8.2)
Sodium: 134 mmol/L — ABNORMAL LOW (ref 136–145)

## 2018-06-21 MED FILL — ARIPIPRAZOLE 5 MG TAB: 5 mg | ORAL | Qty: 3

## 2018-06-21 MED FILL — LAMOTRIGINE 100 MG TAB: 100 mg | ORAL | Qty: 2

## 2018-06-21 MED FILL — PIPERACILLIN-TAZOBACTAM 3.375 GRAM IV SOLR: 3.375 gram | INTRAVENOUS | Qty: 3.38

## 2018-06-21 MED FILL — ACETAMINOPHEN 325 MG TABLET: 325 mg | ORAL | Qty: 2

## 2018-06-21 NOTE — Progress Notes (Signed)
Progress Note    Patient: Kellie Patterson MRN: [id number]  SSN: [id number]    Date of Birth: 08-29-1935  Age: 82 y.o.  Sex: female      Admit Date: 06/19/2018    Abdominal pain    Subjective:     No acute surgical issues.  Pt overall is doing well.  Tolerating clears.  Pain in left lower abdomen is better. HIDA scan from yesterday was otherwise normal.     Objective:     Visit Vitals  BP 173/64 (BP 1 Location: Left arm, BP Patient Position: At rest)   Pulse 69   Temp 97.8 ??F (36.6 ??C)   Resp 16   Ht 4' 9"  (1.448 m)   Wt 171 lb 15.3 oz (78 kg)   SpO2 93%   BMI 37.21 kg/m??       Temp (24hrs), Avg:98 ??F (36.7 ??C), Min:97.6 ??F (36.4 ??C), Max:98.6 ??F (37 ??C)        Physical Exam:    Gen:  NAD  Pulm:  Unlabored  Abd:  S/ND/mild TTP in left abdomen without guarding or rebound    Recent Results (from the past 24 hour(s))   METABOLIC PANEL, COMPREHENSIVE    Collection Time: 06/21/18  5:30 AM   Result Value Ref Range    Sodium 134 (L) 136 - 145 mmol/L    Potassium 4.4 3.5 - 5.1 mmol/L    Chloride 101 97 - 108 mmol/L    CO2 25 21 - 32 mmol/L    Anion gap 8 5 - 15 mmol/L    Glucose 77 65 - 100 mg/dL    BUN 7 6 - 20 MG/DL    Creatinine 0.91 0.55 - 1.02 MG/DL    BUN/Creatinine ratio 8 (L) 12 - 20      GFR est AA >60 >60 ml/min/1.49m    GFR est non-AA 59 (L) >60 ml/min/1.733m   Calcium 9.0 8.5 - 10.1 MG/DL    Bilirubin, total 0.3 0.2 - 1.0 MG/DL    ALT (SGPT) 14 12 - 78 U/L    AST (SGOT) 9 (L) 15 - 37 U/L    Alk. phosphatase 79 45 - 117 U/L    Protein, total 6.0 (L) 6.4 - 8.2 g/dL    Albumin 2.7 (L) 3.5 - 5.0 g/dL    Globulin 3.3 2.0 - 4.0 g/dL    A-G Ratio 0.8 (L) 1.1 - 2.2     MAGNESIUM    Collection Time: 06/21/18  5:30 AM   Result Value Ref Range    Magnesium 2.1 1.6 - 2.4 mg/dL   PHOSPHORUS    Collection Time: 06/21/18  5:30 AM   Result Value Ref Range    Phosphorus 3.5 2.6 - 4.7 MG/DL         Assessment:     Hospital Problems  Never Reviewed          Codes Class Noted POA    Cholecystitis ICD-10-CM: K81.9  ICD-9-CM: 575.10   06/19/2018 Unknown              Plan/Recommendations/Medical Decision Making:     - Advance to GI lite diet  - Out of bed as tolerated  - Possible DC home tomorrow if she is able to tolerate diet

## 2018-06-21 NOTE — Progress Notes (Signed)
 Bedside shift change report given to Tobi Bastos, [employer] (oncoming nurse) by Vernia Buff, RN (offgoing nurse). Report included the following information SBAR, Kardex, Intake/Output and MAR.

## 2018-06-21 NOTE — Progress Notes (Signed)
Progress Note    Patient: Kellie Patterson MRN: [id number]  SSN: [id number]    Date of Birth: [birth year]  Age: 82 y.o.  Sex: female      Admit Date: 06/19/2018    Abdominal pain    Subjective:     No acute surgical issues.  Pt overall is doing well.  Tolerating clears.  Pain in left lower abdomen is better. HIDA scan from yesterday was otherwise normal.     Objective:     Visit Vitals  BP 173/64 (BP 1 Location: Left arm, BP Patient Position: At rest)   Pulse 69   Temp 97.8 ??F (36.6 ??C)   Resp 16   Ht '4\' 9"'  (1.448 m)   Wt 171 lb 15.3 oz (78 kg)   SpO2 93%   BMI 37.21 kg/m??       Temp (24hrs), Avg:98 ??F (36.7 ??C), Min:97.6 ??F (36.4 ??C), Max:98.6 ??F (37 ??C)        Physical Exam:    Gen:  NAD  Pulm:  Unlabored  Abd:  S/ND/mild TTP in left abdomen without guarding or rebound    Recent Results (from the past 24 hour(s))   METABOLIC PANEL, COMPREHENSIVE    Collection Time: 06/21/18  5:30 AM   Result Value Ref Range    Sodium 134 (L) 136 - 145 mmol/L    Potassium 4.4 3.5 - 5.1 mmol/L    Chloride 101 97 - 108 mmol/L    CO2 25 21 - 32 mmol/L    Anion gap 8 5 - 15 mmol/L    Glucose 77 65 - 100 mg/dL    BUN 7 6 - 20 MG/DL    Creatinine 0.91 0.55 - 1.02 MG/DL    BUN/Creatinine ratio 8 (L) 12 - 20      GFR est AA >60 >60 ml/min/1.29m    GFR est non-AA 59 (L) >60 ml/min/1.732m   Calcium 9.0 8.5 - 10.1 MG/DL    Bilirubin, total 0.3 0.2 - 1.0 MG/DL    ALT (SGPT) 14 12 - 78 U/L    AST (SGOT) 9 (L) 15 - 37 U/L    Alk. phosphatase 79 45 - 117 U/L    Protein, total 6.0 (L) 6.4 - 8.2 g/dL    Albumin 2.7 (L) 3.5 - 5.0 g/dL    Globulin 3.3 2.0 - 4.0 g/dL    A-G Ratio 0.8 (L) 1.1 - 2.2     MAGNESIUM    Collection Time: 06/21/18  5:30 AM   Result Value Ref Range    Magnesium 2.1 1.6 - 2.4 mg/dL   PHOSPHORUS    Collection Time: 06/21/18  5:30 AM   Result Value Ref Range    Phosphorus 3.5 2.6 - 4.7 MG/DL         Assessment:     Hospital Problems  Never Reviewed          Codes Class Noted POA    Cholecystitis ICD-10-CM: K81.9   ICD-9-CM: 575.10  06/19/2018 Unknown              Plan/Recommendations/Medical Decision Making:     - Advance to GI lite diet  - Out of bed as tolerated  - Possible DC home tomorrow if she is able to tolerate diet

## 2018-06-21 NOTE — Progress Notes (Signed)
Bedside shift change report given to Anna, RN (oncoming nurse) by Marc, RN (offgoing nurse). Report included the following information SBAR, Kardex, Intake/Output and MAR.

## 2018-06-22 LAB — CBC WITH AUTO DIFFERENTIAL
Basophils %: 1 % (ref 0–1)
Basophils Absolute: 0 10*3/uL (ref 0.0–0.1)
Eosinophils %: 4 % (ref 0–7)
Eosinophils Absolute: 0.4 10*3/uL (ref 0.0–0.4)
Granulocyte Absolute Count: 0 10*3/uL (ref 0.00–0.04)
Hematocrit: 24.7 % — ABNORMAL LOW (ref 35.0–47.0)
Hemoglobin: 7.9 g/dL — ABNORMAL LOW (ref 11.5–16.0)
Immature Granulocytes: 0 % (ref 0.0–0.5)
Lymphocytes %: 16 % (ref 12–49)
Lymphocytes Absolute: 1.4 10*3/uL (ref 0.8–3.5)
MCH: 31.9 PG (ref 26.0–34.0)
MCHC: 32 g/dL (ref 30.0–36.5)
MCV: 99.6 FL — ABNORMAL HIGH (ref 80.0–99.0)
MPV: 9.5 FL (ref 8.9–12.9)
Monocytes %: 6 % (ref 5–13)
Monocytes Absolute: 0.6 10*3/uL (ref 0.0–1.0)
NRBC Absolute: 0 10*3/uL (ref 0.00–0.01)
Neutrophils %: 73 % (ref 32–75)
Neutrophils Absolute: 6.3 10*3/uL (ref 1.8–8.0)
Nucleated RBCs: 0 PER 100 WBC
Platelets: 264 10*3/uL (ref 150–400)
RBC: 2.48 M/uL — ABNORMAL LOW (ref 3.80–5.20)
RDW: 14.2 % (ref 11.5–14.5)
WBC: 8.7 10*3/uL (ref 3.6–11.0)

## 2018-06-22 LAB — COMPREHENSIVE METABOLIC PANEL
ALT: 13 U/L (ref 12–78)
AST: 6 U/L — ABNORMAL LOW (ref 15–37)
Albumin/Globulin Ratio: 0.8 — ABNORMAL LOW (ref 1.1–2.2)
Albumin: 2.7 g/dL — ABNORMAL LOW (ref 3.5–5.0)
Alkaline Phosphatase: 79 U/L (ref 45–117)
Anion Gap: 8 mmol/L (ref 5–15)
BUN: 7 MG/DL (ref 6–20)
Bun/Cre Ratio: 8 — ABNORMAL LOW (ref 12–20)
CO2: 26 mmol/L (ref 21–32)
Calcium: 8.8 MG/DL (ref 8.5–10.1)
Chloride: 101 mmol/L (ref 97–108)
Creatinine: 0.93 MG/DL (ref 0.55–1.02)
EGFR IF NonAfrican American: 58 mL/min/{1.73_m2} — ABNORMAL LOW (ref >60)
GFR African American: >60 mL/min/{1.73_m2} (ref >60)
Globulin: 3.4 g/dL (ref 2.0–4.0)
Glucose: 95 mg/dL (ref 65–100)
Potassium: 4 mmol/L (ref 3.5–5.1)
Sodium: 135 mmol/L — ABNORMAL LOW (ref 136–145)
Total Bilirubin: 0.3 MG/DL (ref 0.2–1.0)
Total Protein: 6.1 g/dL — ABNORMAL LOW (ref 6.4–8.2)

## 2018-06-22 LAB — PHOSPHORUS
Phosphorus: 3 MG/DL (ref 2.6–4.7)
Phosphorus: 3 MG/DL (ref 2.6–4.7)

## 2018-06-22 LAB — MAGNESIUM
Magnesium: 2 mg/dL (ref 1.6–2.4)
Magnesium: 2 mg/dL (ref 1.6–2.4)

## 2018-06-22 LAB — METABOLIC PANEL, COMPREHENSIVE
A-G Ratio: 0.8 — ABNORMAL LOW (ref 1.1–2.2)
ALT (SGPT): 13 U/L (ref 12–78)
AST (SGOT): 6 U/L — ABNORMAL LOW (ref 15–37)
Albumin: 2.7 g/dL — ABNORMAL LOW (ref 3.5–5.0)
Alk. phosphatase: 79 U/L (ref 45–117)
Anion gap: 8 mmol/L (ref 5–15)
BUN/Creatinine ratio: 8 — ABNORMAL LOW (ref 12–20)
BUN: 7 MG/DL (ref 6–20)
Bilirubin, total: 0.3 MG/DL (ref 0.2–1.0)
CO2: 26 mmol/L (ref 21–32)
Calcium: 8.8 MG/DL (ref 8.5–10.1)
Chloride: 101 mmol/L (ref 97–108)
Creatinine: 0.93 MG/DL (ref 0.55–1.02)
GFR est AA: >60 mL/min/{1.73_m2} (ref >60)
GFR est non-AA: 58 mL/min/{1.73_m2} — ABNORMAL LOW (ref >60)
Globulin: 3.4 g/dL (ref 2.0–4.0)
Glucose: 95 mg/dL (ref 65–100)
Potassium: 4 mmol/L (ref 3.5–5.1)
Protein, total: 6.1 g/dL — ABNORMAL LOW (ref 6.4–8.2)
Sodium: 135 mmol/L — ABNORMAL LOW (ref 136–145)

## 2018-06-22 LAB — CBC WITH AUTOMATED DIFF
ABS. BASOPHILS: 0 10*3/uL (ref 0.0–0.1)
ABS. EOSINOPHILS: 0.4 10*3/uL (ref 0.0–0.4)
ABS. IMM. GRANS.: 0 10*3/uL (ref 0.00–0.04)
ABS. LYMPHOCYTES: 1.4 10*3/uL (ref 0.8–3.5)
ABS. MONOCYTES: 0.6 10*3/uL (ref 0.0–1.0)
ABS. NEUTROPHILS: 6.3 10*3/uL (ref 1.8–8.0)
ABSOLUTE NRBC: 0 10*3/uL (ref 0.00–0.01)
BASOPHILS: 1 % (ref 0–1)
EOSINOPHILS: 4 % (ref 0–7)
HCT: 24.7 % — ABNORMAL LOW (ref 35.0–47.0)
HGB: 7.9 g/dL — ABNORMAL LOW (ref 11.5–16.0)
IMMATURE GRANULOCYTES: 0 % (ref 0.0–0.5)
LYMPHOCYTES: 16 % (ref 12–49)
MCH: 31.9 PG (ref 26.0–34.0)
MCHC: 32 g/dL (ref 30.0–36.5)
MCV: 99.6 FL — ABNORMAL HIGH (ref 80.0–99.0)
MONOCYTES: 6 % (ref 5–13)
MPV: 9.5 FL (ref 8.9–12.9)
NEUTROPHILS: 73 % (ref 32–75)
NRBC: 0 PER 100 WBC
PLATELET: 264 10*3/uL (ref 150–400)
RBC: 2.48 M/uL — ABNORMAL LOW (ref 3.80–5.20)
RDW: 14.2 % (ref 11.5–14.5)
WBC: 8.7 10*3/uL (ref 3.6–11.0)

## 2018-06-22 MED ORDER — AMOXICILLIN CLAVULANATE 875 MG-125 MG TAB
875-125 mg | ORAL_TABLET | Freq: Two times a day (BID) | ORAL | 0 refills | Status: AC
Start: 2018-06-22 — End: 2018-06-27

## 2018-06-22 MED ORDER — TRAMADOL 50 MG TAB
50 mg | ORAL_TABLET | Freq: Four times a day (QID) | ORAL | 0 refills | Status: DC | PRN
Start: 2018-06-22 — End: 2018-06-24

## 2018-06-22 MED FILL — PIPERACILLIN-TAZOBACTAM 3.375 GRAM IV SOLR: 3.375 gram | INTRAVENOUS | Qty: 3.38

## 2018-06-22 MED FILL — ARIPIPRAZOLE 10 MG TAB: 10 mg | ORAL | Qty: 1.5

## 2018-06-22 MED FILL — LAMOTRIGINE 100 MG TAB: 100 mg | ORAL | Qty: 2

## 2018-06-22 MED FILL — NORMAL SALINE FLUSH 0.9 % INJECTION SYRINGE: INTRAMUSCULAR | Qty: 10

## 2018-06-22 MED FILL — NORMAL SALINE FLUSH 0.9 % INJECTION SYRINGE: INTRAMUSCULAR | Qty: 30

## 2018-06-22 MED FILL — LEVOTHYROXINE 125 MCG TAB: 125 mcg | ORAL | Qty: 1

## 2018-06-22 NOTE — Progress Notes (Signed)
Progress Note    Patient: Kellie Patterson MRN: [id number]  SSN: [id number]    Date of Birth: [birth year]  Age: 82 y.o.  Sex: female      Admit Date: 06/19/2018    Abdominal pain    Subjective:     No acute surgical issues.  Pt overall is doing well.  Tolerating GI lite diet.  Pain is under control. HIDA scan was otherwise normal.     Objective:     Visit Vitals  BP 164/59 (BP 1 Location: Left arm, BP Patient Position: At rest)   Pulse 76   Temp 98.6 ??F (37 ??C)   Resp 16   Ht 4' 9"  (1.448 m)   Wt 171 lb 15.3 oz (78 kg)   SpO2 92%   BMI 37.21 kg/m??       Temp (24hrs), Avg:97.9 ??F (36.6 ??C), Min:97.6 ??F (36.4 ??C), Max:98.6 ??F (37 ??C)        Physical Exam:    Gen:  NAD  Pulm:  Unlabored  Abd:  S/ND/mild TTP in left abdomen without guarding or rebound    Recent Results (from the past 24 hour(s))   METABOLIC PANEL, COMPREHENSIVE    Collection Time: 06/22/18  4:34 AM   Result Value Ref Range    Sodium 135 (L) 136 - 145 mmol/L    Potassium 4.0 3.5 - 5.1 mmol/L    Chloride 101 97 - 108 mmol/L    CO2 26 21 - 32 mmol/L    Anion gap 8 5 - 15 mmol/L    Glucose 95 65 - 100 mg/dL    BUN 7 6 - 20 MG/DL    Creatinine 0.93 0.55 - 1.02 MG/DL    BUN/Creatinine ratio 8 (L) 12 - 20      GFR est AA >60 >60 ml/min/1.110m    GFR est non-AA 58 (L) >60 ml/min/1.768m   Calcium 8.8 8.5 - 10.1 MG/DL    Bilirubin, total 0.3 0.2 - 1.0 MG/DL    ALT (SGPT) 13 12 - 78 U/L    AST (SGOT) 6 (L) 15 - 37 U/L    Alk. phosphatase 79 45 - 117 U/L    Protein, total 6.1 (L) 6.4 - 8.2 g/dL    Albumin 2.7 (L) 3.5 - 5.0 g/dL    Globulin 3.4 2.0 - 4.0 g/dL    A-G Ratio 0.8 (L) 1.1 - 2.2     MAGNESIUM    Collection Time: 06/22/18  4:34 AM   Result Value Ref Range    Magnesium 2.0 1.6 - 2.4 mg/dL   PHOSPHORUS    Collection Time: 06/22/18  4:34 AM   Result Value Ref Range    Phosphorus 3.0 2.6 - 4.7 MG/DL   CBC WITH AUTOMATED DIFF    Collection Time: 06/22/18  6:08 AM   Result Value Ref Range    WBC 8.7 3.6 - 11.0 K/uL    RBC 2.48 (L) 3.80 - 5.20 M/uL    HGB 7.9 (L)  11.5 - 16.0 g/dL    HCT 24.7 (L) 35.0 - 47.0 %    MCV 99.6 (H) 80.0 - 99.0 FL    MCH 31.9 26.0 - 34.0 PG    MCHC 32.0 30.0 - 36.5 g/dL    RDW 14.2 11.5 - 14.5 %    PLATELET 264 150 - 400 K/uL    MPV 9.5 8.9 - 12.9 FL    NRBC 0.0 0 PER 100 WBC    ABSOLUTE NRBC 0.00  0.00 - 0.01 K/uL    NEUTROPHILS 73 32 - 75 %    LYMPHOCYTES 16 12 - 49 %    MONOCYTES 6 5 - 13 %    EOSINOPHILS 4 0 - 7 %    BASOPHILS 1 0 - 1 %    IMMATURE GRANULOCYTES 0 0.0 - 0.5 %    ABS. NEUTROPHILS 6.3 1.8 - 8.0 K/UL    ABS. LYMPHOCYTES 1.4 0.8 - 3.5 K/UL    ABS. MONOCYTES 0.6 0.0 - 1.0 K/UL    ABS. EOSINOPHILS 0.4 0.0 - 0.4 K/UL    ABS. BASOPHILS 0.0 0.0 - 0.1 K/UL    ABS. IMM. GRANS. 0.0 0.00 - 0.04 K/UL    DF AUTOMATED           Assessment:     Hospital Problems  Never Reviewed          Codes Class Noted POA    Cholecystitis ICD-10-CM: K81.9  ICD-9-CM: 575.10  06/19/2018 Unknown              Plan/Recommendations/Medical Decision Making:     - GI lite diet  - Out of bed as tolerated  - Plan DC home this am

## 2018-06-22 NOTE — Progress Notes (Signed)
Bedside shift change report given to Anna, RN (oncoming nurse) by Marc, RN (offgoing nurse). Report included the following information SBAR, Kardex, Intake/Output, MAR and Recent Results.

## 2018-06-22 NOTE — Progress Notes (Signed)
 Problem: Self Care Deficits Care Plan (Adult)  Goal: *Acute Goals and Plan of Care (Insert Text)  Description    FUNCTIONAL STATUS PRIOR TO ADMISSION: Patient was modified independent using a walker for functional mobility, to and from dining center and activities. Patient required minimal assistance for lower body ADLs and total A instrumental ADLs.     HOME SUPPORT: The patient lived with [facility] staff assistance PRN. Daughter lives nearby.    Occupational Therapy Goals  Initiated 06/22/2018  1.  Patient will perform lower body dressing with supervision/set-up within 7 day(s).  2.  Patient will perform bathing with supervision/set-up within 7 day(s).  3.  Patient will perform standing ADLs 10 mins with supervision/set-up within 7 day(s).  4.  Patient will perform toilet transfers with modified independence within 7 day(s).  5.  Patient will perform all aspects of toileting with modified independence within 7 day(s).         Outcome: Not Met  OCCUPATIONAL THERAPY EVALUATION  Patient: Kellie  Patterson (82 y.o. female)  Date: 06/22/2018  Primary Diagnosis: Cholecystitis [K81.9]        Precautions:        ASSESSMENT  Based on the objective data described below, the patient presents with decreased endurance for general activity and standing tolerance. Baseline: arthritis throughout, knee braces, has worn back brace for comfort to address compression fractures since 2016.    Current Level of Function Impacting Discharge (ADLs/self-care): moderate A lower body ADLs, standing tolerance 7 mins, urinating in the bed instead of toilet     Functional Outcome Measure:  The patient scored Total: 45/100 on the Barthel Index outcome measure which is indicative of 55% impaired ability to care for basic self needs/dependency on others     Patient will benefit from skilled therapy intervention to address the above noted impairments.       PLAN :  Recommendations and Planned Interventions: self care training, functional  mobility training, therapeutic exercise, balance training, therapeutic activities, endurance activities, patient education, home safety training, and family training/education    Frequency/Duration: Patient will be followed by occupational therapy 3 times a week to address goals.    Recommendation for discharge: (in order for the patient to meet his/her long term goals)  Therapy up to 5 days/week in SNF setting or an intensive home health therapy program. Pending progression and AL facility level of assistance they can provided: recommend discharge to familiar environment to maximize independence, toileting routine, OOB routine; food brought to patient's room until patient able to increase walking distance and tolerance room<>dining center 3x/day again.     This discharge recommendation:  Has not yet been discussed the attending provider and/or case management         SUBJECTIVE:   Patient stated "That was a lot of work."    OBJECTIVE DATA SUMMARY:   HISTORY:   Past Medical History:   Diagnosis Date    Chronic back pain     Cognitive impairment     Depression     Hypothyroidism     Osteoporosis     Scoliosis      Past Surgical History:   Procedure Laterality Date    HX GYN      HYSTERECTOMY       Expanded or extensive additional review of patient history:     Home Situation  Home Environment: Assisted living  Care Facility Name: [facility])  Support Systems: Assisted living  Patient Expects to be Discharged to:: Assisted living  Current DME Used/Available at Home: Brace/Splint, Walker, rolling  Tub or Shower Type: Shower    Hand dominance: Right    EXAMINATION OF PERFORMANCE DEFICITS:  Cognitive/Behavioral Status:  Neurologic State: Alert  Orientation Level: Oriented to person;Disoriented to situation;Disoriented to time;Disoriented to place  Cognition: Follows commands;Memory loss  Perception: Appears intact  Perseveration: No perseveration noted  Safety/Judgement: Decreased awareness of environment;Decreased  awareness of need for assistance;Decreased awareness of need for safety;Decreased insight into deficits    Skin: intact    Edema: intact    Hearing:  Auditory  Auditory Impairment: None    Vision/Perceptual:                           Acuity: Impaired near vision;Impaired far vision         Range of Motion:  Shoulder flexion 120*  AROM: Generally decreased, functional                         Strength:  -3/5  Strength: Generally decreased, functional                Coordination:  Coordination: Generally decreased, functional  Fine Motor Skills-Upper: Left Intact;Right Intact    Gross Motor Skills-Upper: Left Intact;Right Intact    Tone & Sensation:  Per patient WDL                            Balance:  Sitting: Intact;Without support  Sitting - Static: Good (unsupported)  Sitting - Dynamic: Good (unsupported)  Standing: Impaired;Without support  Standing - Static: Fair(one hand supportive surface)  Standing - Dynamic : Fair(one hand supportive surface)    Functional Mobility and Transfers for ADLs:  Bed Mobility:  Supine to Sit: Supervision(increased time, exit L side of bed)  Sit to Supine: Supervision(increased time)  Scooting: Supervision(to EOB)    Transfers:  Sit to Stand: Contact guard assistance  Stand to Sit: Contact guard assistance  Bed to Chair: Contact guard assistance  Bathroom Mobility: Supervision/set up(RW, door and light setup)  Toilet Transfer : Minimum assistance(low commode, cues grab bar to L, RW)  Shower Transfer: Total assistance(not safe at this time)    ADL Assessment:  Feeding: Independent    Oral Facial Hygiene/Grooming: Supervision(standing at sink ) 7 mins, cue to wash hands    Bathing: Moderate assistance(infer from ROM and endurance)    Upper Body Dressing: Supervision(setup)    Lower Body Dressing: Moderate assistance(tailor sitting, doffed and donned L socks, A R & B shoes 2* )    Toileting: Minimum assistance(A with brief, RW)      Daughter very upset patient not OOB with nursing,  walking to and from bathroom instead of PureWick and hallway 2* arthritis and memory deficits. Patient will not ring out with needs.           ADL Intervention and task modifications:     Saw patient's daughter talk to CCL and nurse; Instructed CCL on benefits of patient OOB to chair throughout day/meals especially, toileting in bathroom and not using PureWick to maintain patient mental and physical routine to prevent dependency / increase long term memory of urinating in bed. As this will then continue at AL and increase level of care needs long term. CCL concerned as to patient skin care / management.  Cognitive Retraining  Safety/Judgement: Decreased awareness of environment;Decreased awareness of need for assistance;Decreased awareness of need for safety;Decreased insight into deficits    Therapeutic Exercise:     Functional Measure:  Barthel Index:    Bathing: 0  Bladder: 0  Bowels: 10  Grooming: 5  Dressing: 5  Feeding: 10  Mobility: 0  Stairs: 0  Toilet Use: 5  Transfer (Bed to Chair and Back): 10  Total: 45/100        The Barthel ADL Index: Guidelines  1. The index should be used as a record of what a patient does, not as a record of what a patient could do.  2. The main aim is to establish degree of independence from any help, physical or verbal, however minor and for whatever reason.  3. The need for supervision renders the patient not independent.  4. A patient's performance should be established using the best available evidence. Asking the patient, friends/relatives and nurses are the usual sources, but direct observation and common sense are also important. However direct testing is not needed.  5. Usually the patient's performance over the preceding 24-48 hours is important, but occasionally longer periods will be relevant.  6. Middle categories imply that the patient supplies over 50 per cent of the effort.  7. Use of aids to be independent is allowed.    Henriette Desanctis.,  Barthel, D.W. [phone]). Functional evaluation: the Barthel Index. Md State Med J (14)2.  Fleeta der [place], J.J.M.F, Sheldon, DAIVD., Oneita [place]., [place], [place]. (1999). Measuring the change indisability after inpatient rehabilitation; comparison of the responsiveness of the Barthel Index and Functional Independence Measure. Journal of Neurology, Neurosurgery, and Psychiatry, 66(4), [phone].  Fleeta Chillington, N.J.A, Scholte op [place],  W.J.M, & Koopmanschap, M.A. (2004.) Assessment of post-stroke quality of life in cost-effectiveness studies: The usefulness of the Barthel Index and the EuroQoL-5D. Quality of Life Research, 13, 427-43           Pain Rating:  B knees and back, resolved with rest    Activity Tolerance:   requires rest breaks  Please refer to the flowsheet for vital signs taken during this treatment.    After treatment patient left in no apparent distress:    Supine in bed 2* pain and it is the time of day she rests at home, bed rails x3    COMMUNICATION/EDUCATION:   The patient's plan of care was discussed with: Registered Nurse.    Home safety education was provided and the patient/caregiver indicated understanding., Patient/family have participated as able in goal setting and plan of care., and Patient/family agree to work toward stated goals and plan of care.    This patient's plan of care is appropriate for delegation to OTA.    Thank you for this referral.  Arlyne JONELLE Savers  Time Calculation: 39 mins

## 2018-06-22 NOTE — Progress Notes (Signed)
Transition of Care Plan      1. Return to [facility] [place]  [address]  [phone]  2. Contact is DON  Kellie Patterson-- cell [phone]     Will come 06/23/18 to assess for returning to the facility  3.Daughter Kellie Patterson [phone]  will transport if cleared by therapy   4. Therapy evaluated and cleared patient to return to AL   5..Order to resume home health PT/OT speech as patient is receiving home health services at the [facility].  CM talked with Dorris Fetch at the [facility] and patient is serviced by [facility]  [phone]      Reason for Admission:   Abdominal pan   HIDA scan  PCP Kellie Patterson                RRAT Score:        10             Plan for utilizing home health:      Has been receiving  home PT at AL                     Current Advanced Directive/Advance Care Plan: Not in chart--                          Transition of Care Plan:   TBD-- rehab/ return to AL with resumption of [facility]    CM met with patient and daughter, Kellie Patterson [phone], in patient's room to introduce self and explain role.  Patient was alert and oriented,  She confirmed demographics, insurance and PCP Probation officer    Patient resides at [facility].Marland Kitchen She uses walker for mobility and was receiving assistance with adl's and iadl's.  Daughter is supportive.  Daughter transports to appointments and activities.    Patient planning to return to  [facility] and continued [facility].  CM talked with Dorris Fetch at the [facility] and was informed that [facility] services patient.  Cm will call agency tomorrow to confirm.    Daughter will transport patient to the AL when discharged.      Observation notice provided in writing to patient and/or caregiver as well as verbal explanation of the policy.  Patients who are in outpatient status also receive the Observation notice.  Letters givien to patient yesterday by CM Kellie Patterson,  Signed copy placed in chart    CM will follow-- secure home health resumption order--  secure name of [facility] agency servicing patient and send referral..  Cm will provide AL with medicals as requested.

## 2018-06-22 NOTE — Progress Notes (Signed)
1315 Patient's daughter extremely anxious and hypervigilant about patient care. Patient's daughter administered patient supplied phenelzine and notified nursing staff after administering and after being repeatedly educated by primary RN that patient is receiving medication TID as ordered. Patient family educated with safety protocols on administering medications and was asked to refrain from administering home medications. Family verbalized understanding at this time. Patient supplied medications locked in central pharmacy bin for safety.

## 2018-06-22 NOTE — Progress Notes (Signed)
PHYSICAL THERAPY EVALUATION/DISCHARGE  Patient: Kellie Patterson (82 y.o. female)  Date: 06/22/2018  Primary Diagnosis: Cholecystitis [K81.9]       Precautions: fall         ASSESSMENT  Based on the objective data described below, the patient presents with baseline mobility, requiring contact guard assist for upright mobility.  She wears back brace due to multiple compression fractures and it serves as reminder for no forward flexion and support of back.  At baseline normally wears soft knee braces (neopreen?) but not here at hospital.  Daughter provided above information.  Daughter assisted patient up to chair prior to assessment.  Able to ambulate short distance and safe for staff to assist to bathroom (advised to remove pur-wick) and to sit up for meals.  .    Functional Outcome Measure:  The patient scored 0 on the TUG outcome measure due to requiring assist which is indicative of high fall risk.      Other factors to consider for discharge: from assisted living and receiving outpatient PT     Further skilled acute physical therapy is not indicated at this time.     PLAN :  Recommendation for discharge: (in order for the patient to meet his/her long term goals)  Outpatient physical therapy follow up recommended for resumption of services as prior to admission    This discharge recommendation:  Has been made in collaboration with the attending provider and/or case management    IF patient discharges home will need the following DME: none       SUBJECTIVE:   Patient stated "My knees feel weak."    OBJECTIVE DATA SUMMARY:   HISTORY:    Past Medical History:   Diagnosis Date   . Chronic back pain    . Cognitive impairment    . Depression    . Hypothyroidism    . Osteoporosis    . Scoliosis      Past Surgical History:   Procedure Laterality Date   . HX GYN      HYSTERECTOMY       Prior level of function: assisted living-ambulatory with RW  Personal factors and/or comorbidities impacting plan of care:     Home  Situation  Home Environment: Assisted living  Care Facility Name: [facility])  Support Systems: Assisted living  Patient Expects to be Discharged to:: Assisted living  Current DME Used/Available at Home: Brace/Splint, Walker, rolling    EXAMINATION/PRESENTATION/DECISION MAKING:   Critical Behavior:  Neurologic State: Alert  Orientation Level: Disoriented to person, Disoriented to place, Disoriented to situation  Cognition: Follows commands     Hearing:  Auditory  Auditory Impairment: None  Range Of Motion:  AROM: Generally decreased, functional            Strength:    Strength: Generally decreased, functional              Coordination:  Coordination: Generally decreased, functional  Functional Mobility:        Transfers:  Sit to Stand: Contact guard assistance  Stand to Sit: Contact guard assistance        Bed to Chair: Contact guard assistance              Balance:   Sitting: Impaired;Without support  Sitting - Static: Good (unsupported)  Sitting - Dynamic: Good (unsupported)  Standing: Impaired;With support  Standing - Static: Good  Standing - Dynamic : Good  Ambulation/Gait Training:  Distance (ft): 60 Feet (ft)  Assistive Device: Brace/Splint;Gait  belt;Walker, rolling  Ambulation - Level of Assistance: Contact guard assistance     Gait Description (WDL): Exceptions to WDL  Gait Abnormalities: Decreased step clearance              Speed/Cadence: Pace decreased (<100 feet/min)  Step Length: Right shortened;Left shortened            Functional Measure:  Timed up and go:    Timed Get Up And Go Test: (requires assist)       < than 10 seconds=Normal  Greater then 13.5 seconds (in elderly)=Increased fall risk   Shumway-Cook A, Johnnette Gourd, [place] M. Predicting the probability for falls in community dwelling older adults using the Timed Up and Go Test. Phys Ther. 2000;80:896-903.                Physical Therapy Evaluation Charge Determination   History Examination Presentation Decision-Making   LOW Complexity : Zero  comorbidities / personal factors that will impact the outcome / POC LOW Complexity : 1-2 Standardized tests and measures addressing body structure, function, activity limitation and / or participation in recreation  LOW Complexity : Stable, uncomplicated        Based on the above components, the patient evaluation is determined to be of the following complexity level: LOW   After treatment patient left in no apparent distress:   Sitting in chair, Call bell within reach and Caregiver / family present    COMMUNICATION/EDUCATION:   The patient's plan of care was discussed with: Registered Nurse and Child psychotherapist.    Fall prevention education was provided and the patient/caregiver indicated understanding.    Thank you for this referral.  Gerre Pebbles, PT   Time Calculation: 25 mins

## 2018-06-22 NOTE — Progress Notes (Signed)
Spiritual Care Partner Volunteer visited patient in Rm 509 on 06/22/18.  Documented by:  Whitney Caswell, Chaplain, MDiv, MACE  [phone])

## 2018-06-22 NOTE — Progress Notes (Signed)
PHYSICAL THERAPY EVALUATION/DISCHARGE  Patient: Kellie GerlachVirginia Patterson (82 y.o. female)  Date: 06/22/2018  Primary Diagnosis: Cholecystitis [K81.9]       Precautions: fall         ASSESSMENT  Based on the objective data described below, the patient presents with baseline mobility, requiring contact guard assist for upright mobility.  She wears back brace due to multiple compression fractures and it serves as reminder for no forward flexion and support of back.  At baseline normally wears soft knee braces (neopreen?) but not here at hospital.  Daughter provided above information.  Daughter assisted patient up to chair prior to assessment.  Able to ambulate short distance and safe for staff to assist to bathroom (advised to remove pur-wick) and to sit up for meals.  .    Functional Outcome Measure:  The patient scored 0 on the TUG outcome measure due to requiring assist which is indicative of high fall risk.      Other factors to consider for discharge: from assisted living and receiving outpatient PT     Further skilled acute physical therapy is not indicated at this time.     PLAN :  Recommendation for discharge: (in order for the patient to meet his/her long term goals)  Outpatient physical therapy follow up recommended for resumption of services as prior to admission    This discharge recommendation:  Has been made in collaboration with the attending provider and/or case management    IF patient discharges home will need the following DME: none       SUBJECTIVE:   Patient stated ???My knees feel weak.???    OBJECTIVE DATA SUMMARY:   HISTORY:    Past Medical History:   Diagnosis Date   ??? Chronic back pain    ??? Cognitive impairment    ??? Depression    ??? Hypothyroidism    ??? Osteoporosis    ??? Scoliosis      Past Surgical History:   Procedure Laterality Date   ??? HX GYN      HYSTERECTOMY       Prior level of function: assisted living-ambulatory with RW  Personal factors and/or comorbidities impacting plan of care:      Home Situation  Home Environment: Assisted living  Care Facility [facility]: [facility]  Support Systems: Assisted living  Patient Expects to be Discharged to:: Assisted living  Current DME Used/Available at Home: Brace/Splint, Walker, rolling    EXAMINATION/PRESENTATION/DECISION MAKING:   Critical Behavior:  Neurologic State: Alert  Orientation Level: Disoriented to person, Disoriented to place, Disoriented to situation  Cognition: Follows commands     Hearing:  Auditory  Auditory Impairment: None  Range Of Motion:  AROM: Generally decreased, functional            Strength:    Strength: Generally decreased, functional              Coordination:  Coordination: Generally decreased, functional  Functional Mobility:        Transfers:  Sit to Stand: Contact guard assistance  Stand to Sit: Contact guard assistance        Bed to Chair: Contact guard assistance              Balance:   Sitting: Impaired;Without support  Sitting - Static: Good (unsupported)  Sitting - Dynamic: Good (unsupported)  Standing: Impaired;With support  Standing - Static: Good  Standing - Dynamic : Good  Ambulation/Gait Training:  Distance (ft): 60 Feet (ft)  Assistive Device: Brace/Splint;Gait  belt;Walker, rolling  Ambulation - Level of Assistance: Contact guard assistance     Gait Description (WDL): Exceptions to WDL  Gait Abnormalities: Decreased step clearance              Speed/Cadence: Pace decreased (<100 feet/min)  Step Length: Right shortened;Left shortened            Functional Measure:  Timed up and go:    Timed Get Up And Go Test: (requires assist)       < than 10 seconds=Normal  Greater then 13.5 seconds (in elderly)=Increased fall risk   Shumway-Cook A, Johnnette Gourd, [place] M. Predicting the probability for falls in community dwelling older adults using the Timed Up and Go Test. Phys Ther. 2000;80:896-903.                Physical Therapy Evaluation Charge Determination   History Examination Presentation Decision-Making    LOW Complexity : Zero comorbidities / personal factors that will impact the outcome / POC LOW Complexity : 1-2 Standardized tests and measures addressing body structure, function, activity limitation and / or participation in recreation  LOW Complexity : Stable, uncomplicated        Based on the above components, the patient evaluation is determined to be of the following complexity level: LOW   After treatment patient left in no apparent distress:   Sitting in chair, Call bell within reach and Caregiver / family present    COMMUNICATION/EDUCATION:   The patient???s plan of care was discussed with: Registered Nurse and Child psychotherapist.    Fall prevention education was provided and the patient/caregiver indicated understanding.    Thank you for this referral.  Gerre Pebbles, PT   Time Calculation: 25 mins

## 2018-06-22 NOTE — Progress Notes (Signed)
1315 Patient's daughter extremely anxious and hypervigilant about patient care. Patient's daughter administered patient supplied phenelzine and notified nursing staff after administering and after being repeatedly educated by primary RN that patient is receiving medication TID as ordered. Patient family educated with safety protocols on administering medications and was asked to refrain from administering home medications. Family verbalized understanding at this time. Patient supplied medications locked in central pharmacy bin for safety.

## 2018-06-22 NOTE — Progress Notes (Signed)
Progress Note    Patient: Kellie Patterson MRN: [id number]  SSN: [id number]    Date of Birth: 31-May-1936  Age: 82 y.o.  Sex: female      Admit Date: 06/19/2018    Abdominal pain    Subjective:     No acute surgical issues.  Pt overall is doing well.  Tolerating GI lite diet.  Pain is under control. HIDA scan was otherwise normal.     Objective:     Visit Vitals  BP 164/59 (BP 1 Location: Left arm, BP Patient Position: At rest)   Pulse 76   Temp 98.6 ??F (37 ??C)   Resp 16   Ht '4\' 9"'$  (1.448 m)   Wt 171 lb 15.3 oz (78 kg)   SpO2 92%   BMI 37.21 kg/m??       Temp (24hrs), Avg:97.9 ??F (36.6 ??C), Min:97.6 ??F (36.4 ??C), Max:98.6 ??F (37 ??C)        Physical Exam:    Gen:  NAD  Pulm:  Unlabored  Abd:  S/ND/mild TTP in left abdomen without guarding or rebound    Recent Results (from the past 24 hour(s))   METABOLIC PANEL, COMPREHENSIVE    Collection Time: 06/22/18  4:34 AM   Result Value Ref Range    Sodium 135 (L) 136 - 145 mmol/L    Potassium 4.0 3.5 - 5.1 mmol/L    Chloride 101 97 - 108 mmol/L    CO2 26 21 - 32 mmol/L    Anion gap 8 5 - 15 mmol/L    Glucose 95 65 - 100 mg/dL    BUN 7 6 - 20 MG/DL    Creatinine 0.93 0.55 - 1.02 MG/DL    BUN/Creatinine ratio 8 (L) 12 - 20      GFR est AA >60 >60 ml/min/1.52m    GFR est non-AA 58 (L) >60 ml/min/1.739m   Calcium 8.8 8.5 - 10.1 MG/DL    Bilirubin, total 0.3 0.2 - 1.0 MG/DL    ALT (SGPT) 13 12 - 78 U/L    AST (SGOT) 6 (L) 15 - 37 U/L    Alk. phosphatase 79 45 - 117 U/L    Protein, total 6.1 (L) 6.4 - 8.2 g/dL    Albumin 2.7 (L) 3.5 - 5.0 g/dL    Globulin 3.4 2.0 - 4.0 g/dL    A-G Ratio 0.8 (L) 1.1 - 2.2     MAGNESIUM    Collection Time: 06/22/18  4:34 AM   Result Value Ref Range    Magnesium 2.0 1.6 - 2.4 mg/dL   PHOSPHORUS    Collection Time: 06/22/18  4:34 AM   Result Value Ref Range    Phosphorus 3.0 2.6 - 4.7 MG/DL   CBC WITH AUTOMATED DIFF    Collection Time: 06/22/18  6:08 AM   Result Value Ref Range    WBC 8.7 3.6 - 11.0 K/uL    RBC 2.48 (L) 3.80 - 5.20 M/uL     HGB 7.9 (L) 11.5 - 16.0 g/dL    HCT 24.7 (L) 35.0 - 47.0 %    MCV 99.6 (H) 80.0 - 99.0 FL    MCH 31.9 26.0 - 34.0 PG    MCHC 32.0 30.0 - 36.5 g/dL    RDW 14.2 11.5 - 14.5 %    PLATELET 264 150 - 400 K/uL    MPV 9.5 8.9 - 12.9 FL    NRBC 0.0 0 PER 100 WBC    ABSOLUTE NRBC 0.00  0.00 - 0.01 K/uL    NEUTROPHILS 73 32 - 75 %    LYMPHOCYTES 16 12 - 49 %    MONOCYTES 6 5 - 13 %    EOSINOPHILS 4 0 - 7 %    BASOPHILS 1 0 - 1 %    IMMATURE GRANULOCYTES 0 0.0 - 0.5 %    ABS. NEUTROPHILS 6.3 1.8 - 8.0 K/UL    ABS. LYMPHOCYTES 1.4 0.8 - 3.5 K/UL    ABS. MONOCYTES 0.6 0.0 - 1.0 K/UL    ABS. EOSINOPHILS 0.4 0.0 - 0.4 K/UL    ABS. BASOPHILS 0.0 0.0 - 0.1 K/UL    ABS. IMM. GRANS. 0.0 0.00 - 0.04 K/UL    DF AUTOMATED           Assessment:     Hospital Problems  Never Reviewed          Codes Class Noted POA    Cholecystitis ICD-10-CM: K81.9  ICD-9-CM: 575.10  06/19/2018 Unknown              Plan/Recommendations/Medical Decision Making:     - GI lite diet  - Out of bed as tolerated  - Plan DC home this am

## 2018-06-22 NOTE — Progress Notes (Addendum)
Transition of Care Plan      1. Return to [facility] [place]  [address]  [phone]  2. Contact is DON  Mady Haagensen-- cell [phone]     Will come 06/23/18 to assess for returning to the facility  3.Daughter Sierria Bruney [phone]  will transport if cleared by therapy   4. Therapy evaluated and cleared patient to return to AL   5..Order to resume home health PT/OT speech as patient is receiving home health services at the [facility].  CM talked with Dorris Fetch at the [facility] and patient is serviced by [facility]  [phone]      Reason for Admission:   Abdominal pan   HIDA scan  PCP Molly Cage                RRAT Score:        10             Plan for utilizing home health:      Has been receiving  home PT at AL                     Current Advanced Directive/Advance Care Plan: Not in chart--                          Transition of Care Plan:   TBD-- rehab/ return to AL with resumption of [facility]    CM met with patient and daughter, Maryruth Apple [phone], in patient's room to introduce self and explain role.  Patient was alert and oriented,  She confirmed demographics, insurance and PCP Probation officer    Patient resides at [facility] [place] She uses walker for mobility and was receiving assistance with adl's and iadl's.  Daughter is supportive.  Daughter transports to appointments and activities.    Patient planning to return to  [facility] and continued [facility].  CM talked with Dorris Fetch at the [facility] and was informed that [facility] services patient.  Cm will call agency tomorrow to confirm.    Daughter will transport patient to the AL when discharged.      Observation notice provided in writing to patient and/or caregiver as well as verbal explanation of the policy.  Patients who are in outpatient status also receive the Observation notice.  Letters givien to patient yesterday by CM Shona Needles,  Signed copy placed in chart     CM will follow-- secure home health resumption order-- secure name of [facility] agency servicing patient and send referral..  Cm will provide AL with medicals as requested.

## 2018-06-22 NOTE — Progress Notes (Signed)
Problem: Self Care Deficits Care Plan (Adult)  Goal: *Acute Goals and Plan of Care (Insert Text)  Description    FUNCTIONAL STATUS PRIOR TO ADMISSION: Patient was modified independent using a walker for functional mobility, to and from dining center and activities. Patient required minimal assistance for lower body ADLs and total A instrumental ADLs.     HOME SUPPORT: The patient lived with [facility] staff assistance PRN. Daughter lives nearby.    Occupational Therapy Goals  Initiated 06/22/2018  1.  Patient will perform lower body dressing with supervision/set-up within 7 day(s).  2.  Patient will perform bathing with supervision/set-up within 7 day(s).  3.  Patient will perform standing ADLs 10 mins with supervision/set-up within 7 day(s).  4.  Patient will perform toilet transfers with modified independence within 7 day(s).  5.  Patient will perform all aspects of toileting with modified independence within 7 day(s).         Outcome: Not Met  OCCUPATIONAL THERAPY EVALUATION  Patient: Kellie Patterson (82 y.o. female)  Date: 06/22/2018  Primary Diagnosis: Cholecystitis [K81.9]        Precautions:        ASSESSMENT  Based on the objective data described below, the patient presents with decreased endurance for general activity and standing tolerance. Baseline: arthritis throughout, knee braces, has worn back brace for comfort to address compression fractures since 2016.    Current Level of Function Impacting Discharge (ADLs/self-care): moderate A lower body ADLs, standing tolerance 7 mins, urinating in the bed instead of toilet     Functional Outcome Measure:  The patient scored Total: 45/100 on the Barthel Index outcome measure which is indicative of 55% impaired ability to care for basic self needs/dependency on others     Patient will benefit from skilled therapy intervention to address the above noted impairments.       PLAN :  Recommendations and Planned Interventions: self care training, functional  mobility training, therapeutic exercise, balance training, therapeutic activities, endurance activities, patient education, home safety training, and family training/education    Frequency/Duration: Patient will be followed by occupational therapy 3 times a week to address goals.    Recommendation for discharge: (in order for the patient to meet his/her long term goals)  Therapy up to 5 days/week in SNF setting or an intensive home health therapy program. Pending progression and AL facility level of assistance they can provided: recommend discharge to familiar environment to maximize independence, toileting routine, OOB routine; food brought to patient's room until patient able to increase walking distance and tolerance room<>dining center 3x/day again.     This discharge recommendation:  Has not yet been discussed the attending provider and/or case management         SUBJECTIVE:   Patient stated ???That was a lot of work.???    OBJECTIVE DATA SUMMARY:   HISTORY:   Past Medical History:   Diagnosis Date    Chronic back pain     Cognitive impairment     Depression     Hypothyroidism     Osteoporosis     Scoliosis      Past Surgical History:   Procedure Laterality Date    HX GYN      HYSTERECTOMY       Expanded or extensive additional review of patient history:     Home Situation  Home Environment: Assisted living  [place] Name: [facility])  Support Systems: Assisted living  Patient Expects to be Discharged to:: Assisted living  Current DME Used/Available at Home: Brace/Splint, Walker, rolling  Tub or Shower Type: Shower    Hand dominance: Right    EXAMINATION OF PERFORMANCE DEFICITS:  Cognitive/Behavioral Status:  Neurologic State: Alert  Orientation Level: Oriented to person;Disoriented to situation;Disoriented to time;Disoriented to place  Cognition: Follows commands;Memory loss  Perception: Appears intact  Perseveration: No perseveration noted   Safety/Judgement: Decreased awareness of environment;Decreased awareness of need for assistance;Decreased awareness of need for safety;Decreased insight into deficits    Skin: intact    Edema: intact    Hearing:  Auditory  Auditory Impairment: None    Vision/Perceptual:                           Acuity: Impaired near vision;Impaired far vision         Range of Motion:  Shoulder flexion 120*  AROM: Generally decreased, functional                         Strength:  -3/5  Strength: Generally decreased, functional                Coordination:  Coordination: Generally decreased, functional  Fine Motor Skills-Upper: Left Intact;Right Intact    Gross Motor Skills-Upper: Left Intact;Right Intact    Tone & Sensation:  Per patient WDL                            Balance:  Sitting: Intact;Without support  Sitting - Static: Good (unsupported)  Sitting - Dynamic: Good (unsupported)  Standing: Impaired;Without support  Standing - Static: Fair(one hand supportive surface)  Standing - Dynamic : Fair(one hand supportive surface)    Functional Mobility and Transfers for ADLs:  Bed Mobility:  Supine to Sit: Supervision(increased time, exit L side of bed)  Sit to Supine: Supervision(increased time)  Scooting: Supervision(to EOB)    Transfers:  Sit to Stand: Contact guard assistance  Stand to Sit: Contact guard assistance  Bed to Chair: Contact guard assistance  Bathroom Mobility: Supervision/set up(RW, door and light setup)  Toilet Transfer : Minimum assistance(low commode, cues grab bar to L, RW)  Shower Transfer: Total assistance(not safe at this time)    ADL Assessment:  Feeding: Independent    Oral Facial Hygiene/Grooming: Supervision(standing at sink ) 7 mins, cue to wash hands    Bathing: Moderate assistance(infer from ROM and endurance)    Upper Body Dressing: Supervision(setup)    Lower Body Dressing: Moderate assistance(tailor sitting, doffed and donned L socks, A R & B shoes 2* )     Toileting: Minimum assistance(A with brief, RW)      Daughter very upset patient not OOB with nursing, walking to and from bathroom instead of PureWick and hallway 2* arthritis and memory deficits. Patient will not ring out with needs.           ADL Intervention and task modifications:     Saw patient's daughter talk to [place] and nurse; Instructed CCL on benefits of patient OOB to chair throughout day/meals especially, toileting in bathroom and not using PureWick to maintain patient mental and physical routine to prevent dependency / increase long term memory of urinating in bed. As this will then continue at AL and increase level of care needs long term. CCL concerned as to patient skin care / management.  Cognitive Retraining  Safety/Judgement: Decreased awareness of environment;Decreased awareness of need for assistance;Decreased awareness of need for safety;Decreased insight into deficits    Therapeutic Exercise:     Functional Measure:  Barthel Index:    Bathing: 0  Bladder: 0  Bowels: 10  Grooming: 5  Dressing: 5  Feeding: 10  Mobility: 0  Stairs: 0  Toilet Use: 5  Transfer (Bed to Chair and Back): 10  Total: 45/100        The Barthel ADL Index: Guidelines  1. The index should be used as a record of what a patient does, not as a record of what a patient could do.  2. The main aim is to establish degree of independence from any help, physical or verbal, however minor and for whatever reason.  3. The need for supervision renders the patient not independent.  4. A patient's performance should be established using the best available evidence. Asking the patient, friends/relatives and nurses are the usual sources, but direct observation and common sense are also important. However direct testing is not needed.  5. Usually the patient's performance over the preceding 24-48 hours is important, but occasionally longer periods will be relevant.   6. Middle categories imply that the patient supplies over 50 per cent of the effort.  7. Use of aids to be independent is allowed.    Daneen Schick., Barthel, D.W. [phone]). Functional evaluation: the Barthel Index. [place] (14)2.  Lucianne Lei der Tanana, J.J.M.F, [place], Diona Browner., Oris Drone., [place], [place] (1999). Measuring the change indisability after inpatient rehabilitation; comparison of the responsiveness of the Barthel Index and Functional Independence Measure. Journal of Neurology, Neurosurgery, and Psychiatry, 66(4), [phone].  Wilford Sports, N.J.A, Scholte op Philip,  W.J.M, & Koopmanschap, M.A. (2004.) Assessment of post-stroke quality of life in cost-effectiveness studies: The usefulness of the Barthel Index and the EuroQoL-5D. Quality of Life Research, 13, 427-43           Pain Rating:  B knees and back, resolved with rest    Activity Tolerance:   requires rest breaks  Please refer to the flowsheet for vital signs taken during this treatment.    After treatment patient left in no apparent distress:    Supine in bed 2* pain and it is the time of day she rests at home, bed rails x3    COMMUNICATION/EDUCATION:   The patient???s plan of care was discussed with: Registered Nurse.    Home safety education was provided and the patient/caregiver indicated understanding., Patient/family have participated as able in goal setting and plan of care., and Patient/family agree to work toward stated goals and plan of care.    This patient???s plan of care is appropriate for delegation to OTA.    Thank you for this referral.  Jake Shark  Time Calculation: 39 mins

## 2018-06-23 LAB — COMPREHENSIVE METABOLIC PANEL
ALT: 14 U/L (ref 12–78)
AST: 8 U/L — ABNORMAL LOW (ref 15–37)
Albumin/Globulin Ratio: 0.8 — ABNORMAL LOW (ref 1.1–2.2)
Albumin: 2.7 g/dL — ABNORMAL LOW (ref 3.5–5.0)
Alkaline Phosphatase: 77 U/L (ref 45–117)
Anion Gap: 7 mmol/L (ref 5–15)
BUN: 6 MG/DL (ref 6–20)
Bun/Cre Ratio: 7 — ABNORMAL LOW (ref 12–20)
CO2: 26 mmol/L (ref 21–32)
Calcium: 8.7 MG/DL (ref 8.5–10.1)
Chloride: 100 mmol/L (ref 97–108)
Creatinine: 0.84 MG/DL (ref 0.55–1.02)
EGFR IF NonAfrican American: >60 mL/min/{1.73_m2} (ref >60)
GFR African American: >60 mL/min/{1.73_m2} (ref >60)
Globulin: 3.5 g/dL (ref 2.0–4.0)
Glucose: 89 mg/dL (ref 65–100)
Potassium: 3.8 mmol/L (ref 3.5–5.1)
Sodium: 133 mmol/L — ABNORMAL LOW (ref 136–145)
Total Bilirubin: 0.4 MG/DL (ref 0.2–1.0)
Total Protein: 6.2 g/dL — ABNORMAL LOW (ref 6.4–8.2)

## 2018-06-23 LAB — CBC WITH AUTO DIFFERENTIAL
Basophils %: 1 % (ref 0–1)
Basophils Absolute: 0.1 10*3/uL (ref 0.0–0.1)
Eosinophils %: 4 % (ref 0–7)
Eosinophils Absolute: 0.3 10*3/uL (ref 0.0–0.4)
Granulocyte Absolute Count: 0 10*3/uL (ref 0.00–0.04)
Hematocrit: 23.4 % — ABNORMAL LOW (ref 35.0–47.0)
Hemoglobin: 7.6 g/dL — ABNORMAL LOW (ref 11.5–16.0)
Immature Granulocytes: 0 % (ref 0.0–0.5)
Lymphocytes %: 18 % (ref 12–49)
Lymphocytes Absolute: 1.4 10*3/uL (ref 0.8–3.5)
MCH: 32.3 PG (ref 26.0–34.0)
MCHC: 32.5 g/dL (ref 30.0–36.5)
MCV: 99.6 FL — ABNORMAL HIGH (ref 80.0–99.0)
MPV: 9.4 FL (ref 8.9–12.9)
Monocytes %: 7 % (ref 5–13)
Monocytes Absolute: 0.5 10*3/uL (ref 0.0–1.0)
NRBC Absolute: 0 10*3/uL (ref 0.00–0.01)
Neutrophils %: 70 % (ref 32–75)
Neutrophils Absolute: 5.4 10*3/uL (ref 1.8–8.0)
Nucleated RBCs: 0 PER 100 WBC
Platelets: 260 10*3/uL (ref 150–400)
RBC: 2.35 M/uL — ABNORMAL LOW (ref 3.80–5.20)
RDW: 13.8 % (ref 11.5–14.5)
WBC: 7.8 10*3/uL (ref 3.6–11.0)

## 2018-06-23 LAB — PHOSPHORUS
Phosphorus: 3.2 MG/DL (ref 2.6–4.7)
Phosphorus: 3.2 MG/DL (ref 2.6–4.7)

## 2018-06-23 LAB — MAGNESIUM
Magnesium: 2 mg/dL (ref 1.6–2.4)
Magnesium: 2 mg/dL (ref 1.6–2.4)

## 2018-06-23 LAB — CBC WITH AUTOMATED DIFF
ABS. BASOPHILS: 0.1 10*3/uL (ref 0.0–0.1)
ABS. EOSINOPHILS: 0.3 10*3/uL (ref 0.0–0.4)
ABS. IMM. GRANS.: 0 10*3/uL (ref 0.00–0.04)
ABS. LYMPHOCYTES: 1.4 10*3/uL (ref 0.8–3.5)
ABS. MONOCYTES: 0.5 10*3/uL (ref 0.0–1.0)
ABS. NEUTROPHILS: 5.4 10*3/uL (ref 1.8–8.0)
ABSOLUTE NRBC: 0 10*3/uL (ref 0.00–0.01)
BASOPHILS: 1 % (ref 0–1)
EOSINOPHILS: 4 % (ref 0–7)
HCT: 23.4 % — ABNORMAL LOW (ref 35.0–47.0)
HGB: 7.6 g/dL — ABNORMAL LOW (ref 11.5–16.0)
IMMATURE GRANULOCYTES: 0 % (ref 0.0–0.5)
LYMPHOCYTES: 18 % (ref 12–49)
MCH: 32.3 PG (ref 26.0–34.0)
MCHC: 32.5 g/dL (ref 30.0–36.5)
MCV: 99.6 FL — ABNORMAL HIGH (ref 80.0–99.0)
MONOCYTES: 7 % (ref 5–13)
MPV: 9.4 FL (ref 8.9–12.9)
NEUTROPHILS: 70 % (ref 32–75)
NRBC: 0 PER 100 WBC
PLATELET: 260 10*3/uL (ref 150–400)
RBC: 2.35 M/uL — ABNORMAL LOW (ref 3.80–5.20)
RDW: 13.8 % (ref 11.5–14.5)
WBC: 7.8 10*3/uL (ref 3.6–11.0)

## 2018-06-23 LAB — METABOLIC PANEL, COMPREHENSIVE
A-G Ratio: 0.8 — ABNORMAL LOW (ref 1.1–2.2)
ALT (SGPT): 14 U/L (ref 12–78)
AST (SGOT): 8 U/L — ABNORMAL LOW (ref 15–37)
Albumin: 2.7 g/dL — ABNORMAL LOW (ref 3.5–5.0)
Alk. phosphatase: 77 U/L (ref 45–117)
Anion gap: 7 mmol/L (ref 5–15)
BUN/Creatinine ratio: 7 — ABNORMAL LOW (ref 12–20)
BUN: 6 MG/DL (ref 6–20)
Bilirubin, total: 0.4 MG/DL (ref 0.2–1.0)
CO2: 26 mmol/L (ref 21–32)
Calcium: 8.7 MG/DL (ref 8.5–10.1)
Chloride: 100 mmol/L (ref 97–108)
Creatinine: 0.84 MG/DL (ref 0.55–1.02)
GFR est AA: >60 mL/min/{1.73_m2} (ref >60)
GFR est non-AA: >60 mL/min/{1.73_m2} (ref >60)
Globulin: 3.5 g/dL (ref 2.0–4.0)
Glucose: 89 mg/dL (ref 65–100)
Potassium: 3.8 mmol/L (ref 3.5–5.1)
Protein, total: 6.2 g/dL — ABNORMAL LOW (ref 6.4–8.2)
Sodium: 133 mmol/L — ABNORMAL LOW (ref 136–145)

## 2018-06-23 MED FILL — LEVOTHYROXINE 125 MCG TAB: 125 mcg | ORAL | Qty: 1

## 2018-06-23 MED FILL — PIPERACILLIN-TAZOBACTAM 3.375 GRAM IV SOLR: 3.375 gram | INTRAVENOUS | Qty: 3.38

## 2018-06-23 MED FILL — LAMOTRIGINE 100 MG TAB: 100 mg | ORAL | Qty: 2

## 2018-06-23 MED FILL — ARIPIPRAZOLE 5 MG TAB: 5 mg | ORAL | Qty: 1

## 2018-06-23 NOTE — Progress Notes (Signed)
Bedside shift change report given to Ashley, RN (oncoming nurse) by Marc, RN (offgoing nurse). Report included the following information SBAR, Kardex, Intake/Output, MAR and Recent Results.

## 2018-06-23 NOTE — Progress Notes (Signed)
[facility] at [facility]      Subjective     No acute issues, tolerating diet, no abdominal pain, some loose BM    Objective     Patient Vitals for the past 24 hrs:   Temp Pulse Resp BP SpO2   06/23/18 0735 98.4 ??F (36.9 ??C) 71 14 168/65 92 %   06/22/18 2357 98.6 ??F (37 ??C) 73 16 157/56 93 %   06/22/18 2026 97.9 ??F (36.6 ??C) 70 16 149/56 94 %   06/22/18 1519 98.4 ??F (36.9 ??C) 69 12 185/74 94 %       Date 06/22/18 0700 - 06/23/18 0659 06/23/18 0700 - 06/24/18 0659   Shift 0700-1859 1900-0659 24 Hour Total 0700-1859 1900-0659 24 Hour Total   INTAKE   P.O. 240  240        P.O. 240  240      Shift Total(mL/kg) 240(3.1)  240(3.1)      OUTPUT   Urine(mL/kg/hr) 300(0.3)  300(0.2)        Urine Voided 300  300        Urine Occurrence(s) 1 x 2 x 3 x 1 x  1 x   Stool           Stool Occurrence(s)    1 x  1 x   Shift Total(mL/kg) 300(3.8)  300(3.8)      NET -60  -60      Weight (kg) 78 78 78 78 78 78       PE  Pulm - CTAB  CV - RRR  Abd - soft, ND, BS present, NTTP    Labs  Recent Results (from the past 12 hour(s))   METABOLIC PANEL, COMPREHENSIVE    Collection Time: 06/23/18  5:58 AM   Result Value Ref Range    Sodium 133 (L) 136 - 145 mmol/L    Potassium 3.8 3.5 - 5.1 mmol/L    Chloride 100 97 - 108 mmol/L    CO2 26 21 - 32 mmol/L    Anion gap 7 5 - 15 mmol/L    Glucose 89 65 - 100 mg/dL    BUN 6 6 - 20 MG/DL    Creatinine 0.84 0.55 - 1.02 MG/DL    BUN/Creatinine ratio 7 (L) 12 - 20      GFR est AA >60 >60 ml/min/1.73m2    GFR est non-AA >60 >60 ml/min/1.73m2    Calcium 8.7 8.5 - 10.1 MG/DL    Bilirubin, total 0.4 0.2 - 1.0 MG/DL    ALT (SGPT) 14 12 - 78 U/L    AST (SGOT) 8 (L) 15 - 37 U/L    Alk. phosphatase 77 45 - 117 U/L    Protein, total 6.2 (L) 6.4 - 8.2 g/dL    Albumin 2.7 (L) 3.5 - 5.0 g/dL    Globulin 3.5 2.0 - 4.0 g/dL    A-G Ratio 0.8 (L) 1.1 - 2.2     MAGNESIUM    Collection Time: 06/23/18  5:58 AM   Result Value Ref Range     Magnesium 2.0 1.6 - 2.4 mg/dL   PHOSPHORUS    Collection Time: 06/23/18  5:58 AM   Result Value Ref Range    Phosphorus 3.2 2.6 - 4.7 MG/DL   CBC WITH AUTOMATED DIFF    Collection Time: 06/23/18  5:58 AM   Result Value Ref Range    WBC 7.8 3.6 - 11.0 K/uL    RBC 2.35 (  L) 3.80 - 5.20 M/uL    HGB 7.6 (L) 11.5 - 16.0 g/dL    HCT 23.4 (L) 35.0 - 47.0 %    MCV 99.6 (H) 80.0 - 99.0 FL    MCH 32.3 26.0 - 34.0 PG    MCHC 32.5 30.0 - 36.5 g/dL    RDW 13.8 11.5 - 14.5 %    PLATELET 260 150 - 400 K/uL    MPV 9.4 8.9 - 12.9 FL    NRBC 0.0 0 PER 100 WBC    ABSOLUTE NRBC 0.00 0.00 - 0.01 K/uL    NEUTROPHILS 70 32 - 75 %    LYMPHOCYTES 18 12 - 49 %    MONOCYTES 7 5 - 13 %    EOSINOPHILS 4 0 - 7 %    BASOPHILS 1 0 - 1 %    IMMATURE GRANULOCYTES 0 0.0 - 0.5 %    ABS. NEUTROPHILS 5.4 1.8 - 8.0 K/UL    ABS. LYMPHOCYTES 1.4 0.8 - 3.5 K/UL    ABS. MONOCYTES 0.5 0.0 - 1.0 K/UL    ABS. EOSINOPHILS 0.3 0.0 - 0.4 K/UL    ABS. BASOPHILS 0.1 0.0 - 0.1 K/UL    ABS. IMM. GRANS. 0.0 0.00 - 0.04 K/UL    DF AUTOMATED           Assessment     Kellie Patterson is a 82 y.o.yr old female with resolved abdominal pain    Plan     OK for DC home when seen by PT/OT  Abdominal pain has resolved and feeling better  Some loose BM but hopefully should resolve    Kellie Laufer, MD

## 2018-06-23 NOTE — Progress Notes (Signed)
TOC:    Cm discussed discharge plan with attending MD. Patient is ready today if assisted living can accept.     Cm contacted [facility] [phone]) and their DON Mady Haagensen is on her way to the hospital to evaluate the patient.     Both PT and OT evaluated patient yesterday: PT recommended outpatient follow up and OT recommended SNF.    12:00pm: Cm met with patients' daughter and Maudie Mercury, DON from the assisted living where she resides. Cm read to them what PT and OT were recommending (patient ambulated 60 feet with PT and rolling walker and PT did not recommend SNF). DON is adamant that patient is not at her baseline and needs SNF placement. Cm informed both of them that that was fine, but patient would remain in the hospital for several days while we wait for insurance authorization. They were surprised to hear this, thinking patient would not need authorization. Kim DON stated she has connections at both [place] and [place] and was going to contact both of them now to speed up the process.     Referrals sent to [facility] and [facility] via Allscripts, referrals sent to [facility] and [facility] via CC link.    The Plan for Transition of Care is related to the following treatment goals: SNF placement.    The Patient and/or patient representative was provided with a choice of provider and agrees with the discharge plan. [x]  Yes []  No    Freedom of choice list was provided with basic dialogue that supports the patient's individualized plan of care/goals, treatment preferences and shares the quality data associated with the providers. [x]  Yes []  No    2:00pm: Call received from patients' daughter Joelene Millin, [phone]) wanting an update on patients' SNF placement. Cm informed her that both [facility] and [place] were looking at patients' medicals but had not made a decision yet. She asked if patient would be able to be discharged today. Cm again informed her that no, insurance would take a couple  of days. Joelene Millin stated that she wouldn't mind paying privately for her mother to go to [facility] and wanted to know how she could start that process. Cm provided her with the contact information for [facility] admissions for her to pursue that. Joelene Millin also asked if therapy would be working with her mother today, as she was under the impression patient would be discharging today. Cm informed her I would find out if they could see patient today.     3:15pm: Cm called daughter back to inform her that [facility]. Daughter stated she would like to make a call before agreeing on this facility and will call me back.     3:35pm: Cm followed up with daughter who stated she would like to wait to get final answers from her top 2 choices ([facility] and [place]) before agreeing to [place].     4:00pm: [facility] has declined patient, cm notified daughter.       S. Caitlin Helton BSW, ACM

## 2018-06-23 NOTE — Progress Notes (Signed)
 Problem: Mobility Impaired (Adult and Pediatric)  Goal: *Acute Goals and Plan of Care (Insert Text)  Description  FUNCTIONAL STATUS PRIOR TO ADMISSION: Patient was modified independent using a rolling walker for functional mobility.    HOME SUPPORT PRIOR TO ADMISSION: Patient lived in ALF independent with mobility, required assistance with bathing.     Physical Therapy Goals  Initiated 06/23/2018  1.  Patient will move from supine to sit and sit to supine , scoot up and down and roll side to side in bed with modified independence within 7 day(s).    2.  Patient will transfer from bed to chair and chair to bed with modified independence using the least restrictive device within 7 day(s).  3.  Patient will perform sit to stand with modified independence within 7 day(s).  4.  Patient will ambulate with modified independence for 300 feet with the least restrictive device within 7 day(s).        Outcome: Progressing Towards Goal   PHYSICAL THERAPY REEVALUATION  Patient: Kellie Patterson  Patterson (82 y.o. female)  Date: 06/23/2018  Primary Diagnosis: Cholecystitis [K81.9]        Precautions:   Fall      ASSESSMENT  Based on the objective data described below, the patient presents with decreased activity tolerance, balance deficits, and overall weakness. Per DTR, pt did not require assistance for fxnl mobility PTA. Pt seated EOB with intermittent posterior trunk leaning and stated she needed to use bathroom. Pt required assistance to don brace (h/o compression fx). Pt presented with decreased cadence and increased trunk shifting toward R side. Pt began to fatigue and required use of commode. Once finished, pt required assistance for toileting and presented with posterior LOB requiring min A to descent to commode to rest. Pt then pivoted to chair with increased time demonstrating difficulty turning and stepping with L>R foot.    Current Level of Function Impacting Discharge (mobility/balance): min A for transfers and ambulation of  5 ft x RW    Functional Outcome Measure:  The patient scored 0 on the TUG outcome measure due to requiring assist which is indicative of high fall risk.      Other factors to consider for discharge: from ALF receiving assistance for bathing, high fall risk, LOB with transfers requiring assistance, below functional mobility baseline     Patient will benefit from skilled therapy intervention to address the above noted impairments.       PLAN :  Recommendations and Planned Interventions: bed mobility training, transfer training, gait training, therapeutic exercises, neuromuscular re-education, patient and family training/education, and therapeutic activities      Frequency/Duration: Patient will be followed by physical therapy:  5 times a week to address goals.    Recommendation for discharge: (in order for the patient to meet his/her long term goals)  Therapy up to 5 days/week in SNF setting or intensive home health therapy program  Pending progression with mobility and therapy    This discharge recommendation:  Has been made in collaboration with the attending provider and/or case management    Equipment recommendations for successful discharge (if) home: wheelchair         SUBJECTIVE:   Patient stated "where are we going?"    OBJECTIVE DATA SUMMARY:   HISTORY:    Past Medical History:   Diagnosis Date    Chronic back pain     Cognitive impairment     Depression     Hypothyroidism     Osteoporosis  Scoliosis      Past Surgical History:   Procedure Laterality Date    HX GYN      HYSTERECTOMY     Hospital course since last seen and reason for reevaluation: decreased activity tolerance, weakness    Personal factors and/or comorbidities impacting plan of care: cognitive deficits, HOH    Home Situation  Home Environment: Assisted living  Care Facility [facility]: [facility])  Support Systems: Assisted living  Patient Expects to be Discharged to:: Assisted living  Current DME Used/Available at Home: Brace/Splint, Environmental consultant,  rolling  Tub or Shower Type: Shower    EXAMINATION/PRESENTATION/DECISION MAKING:   Critical Behavior:  Neurologic State: Alert  Orientation Level: Oriented to person  Cognition: Follows commands  Safety/Judgement: Decreased awareness of environment, Decreased awareness of need for assistance, Decreased awareness of need for safety, Decreased insight into deficits  Hearing:  Auditory  Auditory Impairment: None  Skin:  intact  Edema: none noted  Range Of Motion:    Strength:          Tone & Sensation:          Coordination:     Vision:      Functional Mobility:  Bed Mobility:     Supine to Sit: Minimum assistance;Assist x1;Adaptive equipment;Additional time        Transfers:  Sit to Stand: Minimum assistance  Stand to Sit: Minimum assistance       Balance:   Sitting: Impaired;Without support  Sitting - Static: Good (unsupported)  Sitting - Dynamic: Fair (occasional)  Standing: Impaired;With support  Standing - Static: Fair  Standing - Dynamic : Fair;Constant support  Ambulation/Gait Training:  Distance (ft): 5 Feet (ft)  Assistive Device: Gait belt;Walker, rolling  Ambulation - Level of Assistance: Minimal assistance;Additional time;Adaptive equipment;Assist x1        Gait Abnormalities: Decreased step clearance;Trunk sway increased;Step to gait        Base of Support: Narrowed;Shift to right  Stance: Weight shift  Speed/Cadence: Slow;Shuffled  Step Length: Left shortened;Right shortened  Swing Pattern: Left asymmetrical;Right asymmetrical          Functional Measure:  Timed up and go:    Timed Get Up And Go Test: 0 (requires assistance)       < than 10 seconds=Normal  Greater then 13.5 seconds (in elderly)=Increased fall risk   Shumway-Cook A, Cherylin RAMAN, [place] M. Predicting the probability for falls in community dwelling older adults using the Timed Up and Go Test. Phys Ther. 2000;80:896-903.       Activity Tolerance:   Fair  Please refer to the flowsheet for vital signs taken during this treatment.    After  treatment patient left in no apparent distress:   Sitting in chair, Call bell within reach, and Caregiver / family present    COMMUNICATION/EDUCATION:   The patient's plan of care was discussed with: Registered Nurse.    Fall prevention education was provided and the patient/caregiver indicated understanding., Patient/family have participated as able in goal setting and plan of care., and Patient/family agree to work toward stated goals and plan of care.    Thank you for this referral.  Maggie Thornhill, PT, DPT   Time Calculation: 23 mins

## 2018-06-23 NOTE — Progress Notes (Signed)
Problem: Mobility Impaired (Adult and Pediatric)  Goal: *Acute Goals and Plan of Care (Insert Text)  Description  FUNCTIONAL STATUS PRIOR TO ADMISSION: Patient was modified independent using a rolling walker for functional mobility.    HOME SUPPORT PRIOR TO ADMISSION: Patient lived in ALF independent with mobility, required assistance with bathing.     Physical Therapy Goals  Initiated 06/23/2018  1.  Patient will move from supine to sit and sit to supine , scoot up and down and roll side to side in bed with modified independence within 7 day(s).    2.  Patient will transfer from bed to chair and chair to bed with modified independence using the least restrictive device within 7 day(s).  3.  Patient will perform sit to stand with modified independence within 7 day(s).  4.  Patient will ambulate with modified independence for 300 feet with the least restrictive device within 7 day(s).        Outcome: Progressing Towards Goal   PHYSICAL THERAPY REEVALUATION  Patient: Kellie Patterson (82 y.o. female)  Date: 06/23/2018  Primary Diagnosis: Cholecystitis [K81.9]        Precautions:   Fall      ASSESSMENT  Based on the objective data described below, the patient presents with decreased activity tolerance, balance deficits, and overall weakness. Per DTR, pt did not require assistance for fxnl mobility PTA. Pt seated EOB with intermittent posterior trunk leaning and stated she needed to use bathroom. Pt required assistance to don brace (h/o compression fx). Pt presented with decreased cadence and increased trunk shifting toward R side. Pt began to fatigue and required use of commode. Once finished, pt required assistance for toileting and presented with posterior LOB requiring min A to descent to commode to rest. Pt then pivoted to chair with increased time demonstrating difficulty turning and stepping with L>R foot.    Current Level of Function Impacting Discharge (mobility/balance): min A  for transfers and ambulation of 5 ft x RW    Functional Outcome Measure:  The patient scored 0 on the TUG outcome measure due to requiring assist which is indicative of high fall risk.      Other factors to consider for discharge: from ALF receiving assistance for bathing, high fall risk, LOB with transfers requiring assistance, below functional mobility baseline     Patient will benefit from skilled therapy intervention to address the above noted impairments.       PLAN :  Recommendations and Planned Interventions: bed mobility training, transfer training, gait training, therapeutic exercises, neuromuscular re-education, patient and family training/education, and therapeutic activities      Frequency/Duration: Patient will be followed by physical therapy:  5 times a week to address goals.    Recommendation for discharge: (in order for the patient to meet his/her long term goals)  Therapy up to 5 days/week in SNF setting or intensive home health therapy program  Pending progression with mobility and therapy    This discharge recommendation:  Has been made in collaboration with the attending provider and/or case management    Equipment recommendations for successful discharge (if) home: wheelchair         SUBJECTIVE:   Patient stated ???where are we going????    OBJECTIVE DATA SUMMARY:   HISTORY:    Past Medical History:   Diagnosis Date    Chronic back pain     Cognitive impairment     Depression     Hypothyroidism     Osteoporosis  Scoliosis      Past Surgical History:   Procedure Laterality Date    HX GYN      HYSTERECTOMY     Hospital course since last seen and reason for reevaluation: decreased activity tolerance, weakness    Personal factors and/or comorbidities impacting plan of care: cognitive deficits, HOH    Home Situation  Home Environment: Assisted living  Care Facility [facility]  Support Systems: Assisted living  Patient Expects to be Discharged to:: Assisted living   Current DME Used/Available at Home: Brace/Splint, Environmental consultantWalker, rolling  Tub or Shower Type: Shower    EXAMINATION/PRESENTATION/DECISION MAKING:   Critical Behavior:  Neurologic State: Alert  Orientation Level: Oriented to person  Cognition: Follows commands  Safety/Judgement: Decreased awareness of environment, Decreased awareness of need for assistance, Decreased awareness of need for safety, Decreased insight into deficits  Hearing:  Auditory  Auditory Impairment: None  Skin:  intact  Edema: none noted  Range Of Motion:    Strength:          Tone & Sensation:          Coordination:     Vision:      Functional Mobility:  Bed Mobility:     Supine to Sit: Minimum assistance;Assist x1;Adaptive equipment;Additional time        Transfers:  Sit to Stand: Minimum assistance  Stand to Sit: Minimum assistance       Balance:   Sitting: Impaired;Without support  Sitting - Static: Good (unsupported)  Sitting - Dynamic: Fair (occasional)  Standing: Impaired;With support  Standing - Static: Fair  Standing - Dynamic : Fair;Constant support  Ambulation/Gait Training:  Distance (ft): 5 Feet (ft)  Assistive Device: Gait belt;Walker, rolling  Ambulation - Level of Assistance: Minimal assistance;Additional time;Adaptive equipment;Assist x1        Gait Abnormalities: Decreased step clearance;Trunk sway increased;Step to gait        Base of Support: Narrowed;Shift to right  Stance: Weight shift  Speed/Cadence: Slow;Shuffled  Step Length: Left shortened;Right shortened  Swing Pattern: Left asymmetrical;Right asymmetrical          Functional Measure:  Timed up and go:    Timed Get Up And Go Test: 0 (requires assistance)       < than 10 seconds=Normal  Greater then 13.5 seconds (in elderly)=Increased fall risk   Shumway-Cook A, Johnnette GourdBrauer S, [place] M. Predicting the probability for falls in community dwelling older adults using the Timed Up and Go Test. Phys Ther. 2000;80:896-903.       Activity Tolerance:   Fair   Please refer to the flowsheet for vital signs taken during this treatment.    After treatment patient left in no apparent distress:   Sitting in chair, Call bell within reach, and Caregiver / family present    COMMUNICATION/EDUCATION:   The patient???s plan of care was discussed with: Registered Nurse.    Fall prevention education was provided and the patient/caregiver indicated understanding., Patient/family have participated as able in goal setting and plan of care., and Patient/family agree to work toward stated goals and plan of care.    Thank you for this referral.  Alfonse AlpersMaggie Thornhill, PT, DPT   Time Calculation: 23 mins

## 2018-06-23 NOTE — Progress Notes (Signed)
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 240  240        P.O. 240  240      Shift Total(mL/kg) 240(3.1)  240(3.1)      OUTPUT   Urine(mL/kg/hr) 300(0.3)  300(0.2)        Urine Voided 300  300        Urine Occurrence(s) 1 x 2 x 3 x 1 x  1 x   Stool           Stool Occurrence(s)    1 x  1 x   Shift Total(mL/kg) 300(3.8)  300(3.8)      NET -60  -60      Weight (kg) 78 78 78 78 78 78       PE  Pulm - CTAB  CV - RRR  Abd - soft, ND, BS present, NTTP    Labs  Recent Results (from the past 12 hour(s))   METABOLIC PANEL, COMPREHENSIVE    Collection Time: 06/23/18  5:58 AM   Result Value Ref Range    Sodium 133 (L) 136 - 145 mmol/L    Potassium 3.8 3.5 - 5.1 mmol/L    Chloride 100 97 - 108 mmol/L    CO2 26 21 - 32 mmol/L    Anion gap 7 5 - 15 mmol/L    Glucose 89 65 - 100 mg/dL    BUN 6 6 - 20 MG/DL    Creatinine 0.84 0.55 - 1.02 MG/DL    BUN/Creatinine ratio 7 (L) 12 - 20      GFR est AA >60 >60 ml/min/1.84m    GFR est non-AA >60 >60 ml/min/1.781m   Calcium 8.7 8.5 - 10.1 MG/DL    Bilirubin, total 0.4 0.2 - 1.0 MG/DL    ALT (SGPT) 14 12 - 78 U/L    AST (SGOT) 8 (L) 15 - 37 U/L    Alk. phosphatase 77 45 - 117 U/L    Protein, total 6.2 (L) 6.4 - 8.2 g/dL    Albumin 2.7 (L) 3.5 - 5.0 g/dL    Globulin 3.5 2.0 - 4.0 g/dL    A-G Ratio 0.8 (L) 1.1 - 2.2     MAGNESIUM    Collection Time: 06/23/18  5:58 AM   Result Value Ref Range     Magnesium 2.0 1.6 - 2.4 mg/dL   PHOSPHORUS    Collection Time: 06/23/18  5:58 AM   Result Value Ref Range    Phosphorus 3.2 2.6 - 4.7 MG/DL   CBC WITH AUTOMATED DIFF    Collection Time: 06/23/18  5:58 AM   Result Value Ref Range    WBC 7.8 3.6 - 11.0 K/uL    RBC 2.35 (  L) 3.80 - 5.20 M/uL    HGB 7.6 (L) 11.5 - 16.0 g/dL    HCT 23.4 (L) 35.0 - 47.0 %    MCV 99.6 (H) 80.0 - 99.0 FL    MCH 32.3 26.0 - 34.0 PG    MCHC 32.5 30.0 - 36.5 g/dL    RDW 13.8 11.5 - 14.5 %    PLATELET 260 150 - 400 K/uL    MPV 9.4 8.9 - 12.9 FL    NRBC 0.0 0 PER 100 WBC    ABSOLUTE NRBC 0.00 0.00 - 0.01 K/uL    NEUTROPHILS 70 32 - 75 %    LYMPHOCYTES 18 12 - 49 %    MONOCYTES 7 5 - 13 %    EOSINOPHILS 4 0 - 7 %    BASOPHILS 1 0 - 1 %    IMMATURE GRANULOCYTES 0 0.0 - 0.5 %    ABS. NEUTROPHILS 5.4 1.8 - 8.0 K/UL    ABS. LYMPHOCYTES 1.4 0.8 - 3.5 K/UL    ABS. MONOCYTES 0.5 0.0 - 1.0 K/UL    ABS. EOSINOPHILS 0.3 0.0 - 0.4 K/UL    ABS. BASOPHILS 0.1 0.0 - 0.1 K/UL    ABS. IMM. GRANS. 0.0 0.00 - 0.04 K/UL    DF AUTOMATED           Assessment     Kellie Patterson is a 82 y.o.yr old female with resolved abdominal pain    Plan     OK for DC home when seen by PT/OT  Abdominal pain has resolved and feeling better  Some loose BM but hopefully should resolve    Debbe Bales, MD

## 2018-06-23 NOTE — Progress Notes (Addendum)
TOC:    Cm discussed discharge plan with attending MD. Patient is ready today if assisted living can accept.     Cm contacted [facility] [phone]) and their DON Mady Haagensen is on her way to the hospital to evaluate the patient.     Both PT and OT evaluated patient yesterday: PT recommended outpatient follow up and OT recommended SNF.    12:00pm: Cm met with patients' daughter and Maudie Mercury, DON from the assisted living where she resides. Cm read to them what PT and OT were recommending (patient ambulated 60 feet with PT and rolling walker and PT did not recommend SNF). DON is adamant that patient is not at her baseline and needs SNF placement. Cm informed both of them that that was fine, but patient would remain in the hospital for several days while we wait for insurance authorization. They were surprised to hear this, thinking patient would not need authorization. Kim DON stated she has connections at both [place] and [place] and was going to contact both of them now to speed up the process.     Referrals sent to [facility] and [facility] via Allscripts, referrals sent to [facility] and [facility] via CC link.    The Plan for Transition of Care is related to the following treatment goals: SNF placement.    The Patient and/or patient representative was provided with a choice of provider and agrees with the discharge plan. [x] Yes [] No    Freedom of choice list was provided with basic dialogue that supports the patient's individualized plan of care/goals, treatment preferences and shares the quality data associated with the providers. [x] Yes [] No    2:00pm: Call received from patients' daughter Joelene Millin, [phone]) wanting an update on patients' SNF placement. Cm informed her that both [facility] and [facility] were looking at patients' medicals but had not made a decision yet. She asked if patient would be able to be discharged today. Cm again informed her that no, insurance would take a  couple of days. Joelene Millin stated that she wouldn't mind paying privately for her mother to go to [facility] and wanted to know how she could start that process. Cm provided her with the contact information for [facility] admissions for her to pursue that. Joelene Millin also asked if therapy would be working with her mother today, as she was under the impression patient would be discharging today. Cm informed her I would find out if they could see patient today.     3:15pm: Cm called daughter back to inform her that [facility]. Daughter stated she would like to make a call before agreeing on this facility and will call me back.     3:35pm: Cm followed up with daughter who stated she would like to wait to get final answers from her top 2 choices ([facility] and [facility]) before agreeing to [facility].     4:00pm: [facility] has declined patient, cm notified daughter.       S. Caitlin Helton BSW, ACM

## 2018-06-24 LAB — COMPREHENSIVE METABOLIC PANEL
ALT: 13 U/L (ref 12–78)
AST: 8 U/L — ABNORMAL LOW (ref 15–37)
Albumin/Globulin Ratio: 0.8 — ABNORMAL LOW (ref 1.1–2.2)
Albumin: 2.8 g/dL — ABNORMAL LOW (ref 3.5–5.0)
Alkaline Phosphatase: 82 U/L (ref 45–117)
Anion Gap: 5 mmol/L (ref 5–15)
BUN: 9 MG/DL (ref 6–20)
Bun/Cre Ratio: 10 — ABNORMAL LOW (ref 12–20)
CO2: 26 mmol/L (ref 21–32)
Calcium: 8.7 MG/DL (ref 8.5–10.1)
Chloride: 100 mmol/L (ref 97–108)
Creatinine: 0.86 MG/DL (ref 0.55–1.02)
EGFR IF NonAfrican American: >60 mL/min/{1.73_m2} (ref >60)
GFR African American: >60 mL/min/{1.73_m2} (ref >60)
Globulin: 3.4 g/dL (ref 2.0–4.0)
Glucose: 92 mg/dL (ref 65–100)
Potassium: 3.8 mmol/L (ref 3.5–5.1)
Sodium: 131 mmol/L — ABNORMAL LOW (ref 136–145)
Total Bilirubin: 0.5 MG/DL (ref 0.2–1.0)
Total Protein: 6.2 g/dL — ABNORMAL LOW (ref 6.4–8.2)

## 2018-06-24 LAB — CBC WITH AUTO DIFFERENTIAL
Basophils %: 1 % (ref 0–1)
Basophils Absolute: 0.1 10*3/uL (ref 0.0–0.1)
Eosinophils %: 4 % (ref 0–7)
Eosinophils Absolute: 0.3 10*3/uL (ref 0.0–0.4)
Granulocyte Absolute Count: 0 10*3/uL (ref 0.00–0.04)
Hematocrit: 23.4 % — ABNORMAL LOW (ref 35.0–47.0)
Hemoglobin: 7.4 g/dL — ABNORMAL LOW (ref 11.5–16.0)
Immature Granulocytes: 0 % (ref 0.0–0.5)
Lymphocytes %: 15 % (ref 12–49)
Lymphocytes Absolute: 1.4 10*3/uL (ref 0.8–3.5)
MCH: 31.4 PG (ref 26.0–34.0)
MCHC: 31.6 g/dL (ref 30.0–36.5)
MCV: 99.2 FL — ABNORMAL HIGH (ref 80.0–99.0)
MPV: 9.6 FL (ref 8.9–12.9)
Monocytes %: 9 % (ref 5–13)
Monocytes Absolute: 0.8 10*3/uL (ref 0.0–1.0)
NRBC Absolute: 0 10*3/uL (ref 0.00–0.01)
Neutrophils %: 71 % (ref 32–75)
Neutrophils Absolute: 6.7 10*3/uL (ref 1.8–8.0)
Nucleated RBCs: 0 PER 100 WBC
Platelets: 257 10*3/uL (ref 150–400)
RBC: 2.36 M/uL — ABNORMAL LOW (ref 3.80–5.20)
RDW: 13.8 % (ref 11.5–14.5)
WBC: 9.3 10*3/uL (ref 3.6–11.0)

## 2018-06-24 LAB — MAGNESIUM
Magnesium: 1.9 mg/dL (ref 1.6–2.4)
Magnesium: 1.9 mg/dL (ref 1.6–2.4)

## 2018-06-24 LAB — PHOSPHORUS
Phosphorus: 2.9 MG/DL (ref 2.6–4.7)
Phosphorus: 2.9 MG/DL (ref 2.6–4.7)

## 2018-06-24 LAB — CBC WITH AUTOMATED DIFF
ABS. BASOPHILS: 0.1 10*3/uL (ref 0.0–0.1)
ABS. EOSINOPHILS: 0.3 10*3/uL (ref 0.0–0.4)
ABS. IMM. GRANS.: 0 10*3/uL (ref 0.00–0.04)
ABS. LYMPHOCYTES: 1.4 10*3/uL (ref 0.8–3.5)
ABS. MONOCYTES: 0.8 10*3/uL (ref 0.0–1.0)
ABS. NEUTROPHILS: 6.7 10*3/uL (ref 1.8–8.0)
ABSOLUTE NRBC: 0 10*3/uL (ref 0.00–0.01)
BASOPHILS: 1 % (ref 0–1)
EOSINOPHILS: 4 % (ref 0–7)
HCT: 23.4 % — ABNORMAL LOW (ref 35.0–47.0)
HGB: 7.4 g/dL — ABNORMAL LOW (ref 11.5–16.0)
IMMATURE GRANULOCYTES: 0 % (ref 0.0–0.5)
LYMPHOCYTES: 15 % (ref 12–49)
MCH: 31.4 PG (ref 26.0–34.0)
MCHC: 31.6 g/dL (ref 30.0–36.5)
MCV: 99.2 FL — ABNORMAL HIGH (ref 80.0–99.0)
MONOCYTES: 9 % (ref 5–13)
MPV: 9.6 FL (ref 8.9–12.9)
NEUTROPHILS: 71 % (ref 32–75)
NRBC: 0 PER 100 WBC
PLATELET: 257 10*3/uL (ref 150–400)
RBC: 2.36 M/uL — ABNORMAL LOW (ref 3.80–5.20)
RDW: 13.8 % (ref 11.5–14.5)
WBC: 9.3 10*3/uL (ref 3.6–11.0)

## 2018-06-24 LAB — METABOLIC PANEL, COMPREHENSIVE
A-G Ratio: 0.8 — ABNORMAL LOW (ref 1.1–2.2)
ALT (SGPT): 13 U/L (ref 12–78)
AST (SGOT): 8 U/L — ABNORMAL LOW (ref 15–37)
Albumin: 2.8 g/dL — ABNORMAL LOW (ref 3.5–5.0)
Alk. phosphatase: 82 U/L (ref 45–117)
Anion gap: 5 mmol/L (ref 5–15)
BUN/Creatinine ratio: 10 — ABNORMAL LOW (ref 12–20)
BUN: 9 MG/DL (ref 6–20)
Bilirubin, total: 0.5 MG/DL (ref 0.2–1.0)
CO2: 26 mmol/L (ref 21–32)
Calcium: 8.7 MG/DL (ref 8.5–10.1)
Chloride: 100 mmol/L (ref 97–108)
Creatinine: 0.86 MG/DL (ref 0.55–1.02)
GFR est AA: >60 mL/min/{1.73_m2} (ref >60)
GFR est non-AA: >60 mL/min/{1.73_m2} (ref >60)
Globulin: 3.4 g/dL (ref 2.0–4.0)
Glucose: 92 mg/dL (ref 65–100)
Potassium: 3.8 mmol/L (ref 3.5–5.1)
Protein, total: 6.2 g/dL — ABNORMAL LOW (ref 6.4–8.2)
Sodium: 131 mmol/L — ABNORMAL LOW (ref 136–145)

## 2018-06-24 MED FILL — LEVOTHYROXINE 125 MCG TAB: 125 mcg | ORAL | Qty: 1

## 2018-06-24 MED FILL — PIPERACILLIN-TAZOBACTAM 3.375 GRAM IV SOLR: 3.375 gram | INTRAVENOUS | Qty: 3.38

## 2018-06-24 MED FILL — NORMAL SALINE FLUSH 0.9 % INJECTION SYRINGE: INTRAMUSCULAR | Qty: 10

## 2018-06-24 MED FILL — LAMOTRIGINE 100 MG TAB: 100 mg | ORAL | Qty: 2

## 2018-06-24 MED FILL — NORMAL SALINE FLUSH 0.9 % INJECTION SYRINGE: INTRAMUSCULAR | Qty: 20

## 2018-06-24 MED FILL — ARIPIPRAZOLE 5 MG TAB: 5 mg | ORAL | Qty: 1

## 2018-06-24 NOTE — Progress Notes (Signed)
Spiritual Care Partner Volunteer visited patient in room 509/01  on 12.17.19.  Documented by: Chaplain: Rev. Rachel K. Cobb, MDiv; B[facility], to contact Spiritual Care Services call: [phone]

## 2018-06-24 NOTE — Progress Notes (Signed)
Care Management -  Received called from [name]. She said she just received a call from Kellie Patterson [phone]) at [facility]. They have been waiting all day for authorization from the insurance. He said they have authorization and daughter is ready to take patient. Daughter will transport patient.    Reviewed chart. Discharge order is in. AVS is complete. Per unit secretary, envelope is on chart. Unit secretary will notify patient's nurse patient is ready for discharge and that RN will need to call report.    Called Kellie Patterson, admissions at [facility] (SNF). He said they are ready to accept patient tonight. They already have the paperwork. The number for her to call report is [phone].     Updated patient's daughter. Daughter will take envelope to facility.    RN to call report to [phone]. RN will need to place Kardex, AVS, and MAR in envelope.     Lowella Dellina E McNeel, MSW

## 2018-06-24 NOTE — Progress Notes (Signed)
TOC:    [employer] has accepted patient and started English as a second language teacher. They expect to obtain auth in a couple of hours. Cm contacted patients' daughter Cala Bradford, [phone]) to update her.     Once Berkley Harvey is obtained, Cala Bradford will provide transportation.    2:00pm: Cm met with daughter, updated her that we are still waiting on insurance authorization.     Will follow.    S. Caitlin Helton BSW, ACM

## 2018-06-24 NOTE — Progress Notes (Signed)
Spiritual Care Partner Volunteer visited patient in room 509/01  on 12.17.19.  Documented by: Chaplain: Rev. Cyndia Diverachel K. Cobb, MDiv; BCC, to contact Spiritual Care Services call: [phone]

## 2018-06-24 NOTE — Progress Notes (Signed)
General Surgery Daily Progress Note    Admit Date: 06/19/2018  Post-Operative Day: * No surgery found * from * No surgery found *     Subjective:     Last 24 hrs: Pt cont to do well. Tolerating diet w/o abd pain       Objective:     Blood pressure 144/63, pulse 70, temperature 97.9 ??F (36.6 ??C), resp. rate 17, height 4\' 9"  (1.448 m), weight 171 lb 15.3 oz (78 kg), SpO2 95 %.  Temp (24hrs), Avg:98.4 ??F (36.9 ??C), Min:97.9 ??F (36.6 ??C), Max:98.8 ??F (37.1 ??C)      _____________________  Physical Exam:     Alert and Oriented, x3, in no acute distress.  Cardiovascular: RRR, no peripheral edema  Abdomen: soft, nl BS NT      Assessment:   Active Problems:    Cholecystitis (06/19/2018)            Plan:     D/c to [facility]  May contact office if sx reoccur    Data Review:    Recent Labs     06/24/18  0051 06/23/18  0558 06/22/18  0608   WBC 9.3 7.8 8.7   HGB 7.4* 7.6* 7.9*   HCT 23.4* 23.4* 24.7*   PLT 257 260 264     Recent Labs     06/24/18  0051 06/23/18  0558 06/22/18  0434   NA 131* 133* 135*   K 3.8 3.8 4.0   CL 100 100 101   CO2 26 26 26    GLU 92 89 95   BUN 9 6 7    CREA 0.86 0.84 0.93   CA 8.7 8.7 8.8   MG 1.9 2.0 2.0   PHOS 2.9 3.2 3.0   ALB 2.8* 2.7* 2.7*   SGOT 8* 8* 6*   ALT 13 14 13      No results for input(s): AML, LPSE in the last 72 hours.        ______________________  Medications:    Current Facility-Administered Medications   Medication Dose Route Frequency   ??? levothyroxine (SYNTHROID) tablet 62.5 mcg  62.5 mcg Oral ACB   ??? lamoTRIgine (LaMICtal) tablet 200 mg  200 mg Oral DAILY   ??? hydrALAZINE (APRESOLINE) 20 mg/mL injection 20 mg  20 mg IntraVENous Q6H PRN   ??? sodium chloride (NS) flush 5-40 mL  5-40 mL IntraVENous Q8H   ??? sodium chloride (NS) flush 5-40 mL  5-40 mL IntraVENous PRN   ??? lactated Ringers infusion  100 mL/hr IntraVENous CONTINUOUS   ??? acetaminophen (TYLENOL) tablet 650 mg  650 mg Oral Q6H PRN   ??? naloxone (NARCAN) injection 0.4 mg  0.4 mg IntraVENous PRN   ??? ondansetron (ZOFRAN)  injection 4 mg  4 mg IntraVENous Q4H PRN   ??? diphenhydrAMINE (BENADRYL) injection 12.5 mg  12.5 mg IntraVENous Q6H PRN   ??? piperacillin-tazobactam (ZOSYN) 3.375 g in 0.9% sodium chloride (MBP/ADV) 100 mL  3.375 g IntraVENous Q8H   ??? ARIPiprazole (ABILIFY) tablet 15 mg  15 mg Oral DAILY   ??? phenelzine (NARDIL) tablet 15 mg (Patient Supplied)  15 mg Oral TID   ??? HYDROmorphone (PF) (DILAUDID) injection 0.5-1 mg  0.5-1 mg IntraVENous Q4H PRN       Berneice GandyMary S Meleny Tregoning, NP  06/24/2018

## 2018-06-24 NOTE — Progress Notes (Signed)
Bedside and Verbal shift change report given to Sharlene RN (oncoming nurse) by Alyssa RN (offgoing nurse). Report included the following information SBAR and Kardex.

## 2018-06-24 NOTE — Discharge Summary (Signed)
Physician Discharge Summary     Patient ID:  Kellie GerlachVirginia Tewksbury  [id number]  82 y.o.  10/12/1935    Admit Date: 06/19/2018    Discharge Date: 06/24/2018    Admission Diagnoses: Cholecystitis [K81.9]    Discharge Diagnoses:  Active Problems:    Cholecystitis (06/19/2018)         Admission Condition: Fair    Discharge Condition: Good    Last Procedure: * No surgery found [facility]*      Hospital Course:   Pt was admitted w/ abd pain; U/s showed possible cholecystitis w/o stones or biliary dilitation; HIDA showed nl gallbladder emptying. She was started on a diet and had good bowel function.  No surgery performed.    Consults:    Disposition: long term care facility    Patient Instructions:   Current Discharge Medication List      START taking these medications    Details   amoxicillin-clavulanate (AUGMENTIN) 875-125 mg per tablet Take 1 Tab by mouth every twelve (12) hours for 5 days.  Qty: 10 Tab, Refills: 0      traMADol (ULTRAM) 50 mg tablet Take 1 Tab by mouth every six (6) hours as needed for Pain for up to 7 days. Max Daily Amount: 200 mg.  Qty: 20 Tab, Refills: 0    Associated Diagnoses: Abdominal pain, epigastric         CONTINUE these medications which have NOT CHANGED    Details   multivitamin,tx-iron-ca-min (THERA-M) 27-0.4 mg tab Take 1 Tab by mouth daily.      acetaminophen (TYLENOL) 500 mg tablet Take 1,000 mg by mouth every six (6) hours as needed for Pain.      ARIPiprazole (ABILIFY) 15 mg tablet Take 15 mg by mouth daily.      calcium carbonate/vitamin D3 (CALCIUM + D PO) Take 1 Tab by mouth every evening. (calcium 600 mg + vitamin D 800 units)      cephALEXin (KEFLEX) 500 mg capsule Take 500 mg by mouth three (3) times daily. (started on 06-16-18 x 5 days)      ferrous sulfate 325 mg (65 mg iron) tablet Take 325 mg by mouth daily (with dinner).      Omega-3 Fatty Acids (FISH OIL) 500 mg cap Take 1,000 mg by mouth daily.      lamoTRIgine (LAMICTAL) 100 mg tablet Take 200 mg by mouth daily.      levothyroxine  (SYNTHROID) 125 mcg tablet Take 62.5 mcg by mouth Daily (before breakfast). (dose = 0.5 x 125 mcg tablet)      meloxicam (MOBIC) 7.5 mg tablet Take 7.5 mg by mouth daily.      phenelzine (NARDIL) 15 mg tablet Take 15 mg by mouth three (3) times daily as needed.      cholecalciferol (VITAMIN D3) (1000 Units /25 mcg) tablet Take 1,000 mg by mouth daily.      senna (SENEXON) 8.6 mg tablet Take 1 Tab by mouth nightly as needed for Constipation.             Activity - as tolerated  Diet - as tolerated    Instructed patient to return to ER or call our office if abd pain returns    Signed:  Berneice GandyMary S Chrisy Hillebrand, NP  06/24/2018  11:56 AM

## 2018-06-24 NOTE — Progress Notes (Signed)
Problem: Mobility Impaired (Adult and Pediatric)  Goal: *Acute Goals and Plan of Care (Insert Text)  Description  FUNCTIONAL STATUS PRIOR TO ADMISSION: Patient was modified independent using a rolling walker for functional mobility.    HOME SUPPORT PRIOR TO ADMISSION: Patient lived in ALF independent with mobility, required assistance with bathing.     Physical Therapy Goals  Initiated 06/23/2018  1.  Patient will move from supine to sit and sit to supine , scoot up and down and roll side to side in bed with modified independence within 7 day(s).    2.  Patient will transfer from bed to chair and chair to bed with modified independence using the least restrictive device within 7 day(s).  3.  Patient will perform sit to stand with modified independence within 7 day(s).  4.  Patient will ambulate with modified independence for 300 feet with the least restrictive device within 7 day(s).        Outcome: Progressing Towards Goal   PHYSICAL THERAPY TREATMENT  Patient: Kellie Patterson [phone] y.o. female)  Date: 06/24/2018  Diagnosis: Cholecystitis [K81.9]   <principal problem not specified>       Precautions: Fall  Chart, physical therapy assessment, plan of care and goals were reviewed.    ASSESSMENT  Patient continues with skilled PT services and is progressing towards goals. Patient received supine in bed, agreeable to therapy. She was overall min A for functional mobility and activity tolerance improved this session compared to yesterday. Noted significant wheezing following ambulation on both trials, sats stable. Discussed this with nursing. Also complaining of significant L knee pain during mobility and per her report this is new.  Overall min A which is a decline from baseline.    Current Level of Function Impacting Discharge (mobility/balance): min A    Other factors to consider for discharge:  usually mod I at ALF         PLAN :  Patient continues to benefit from skilled intervention to address the above  impairments.  Continue treatment per established plan of care.  to address goals.    Recommendation for discharge: (in order for the patient to meet his/her long term goals)  Therapy up to 5 days/week in SNF setting    This discharge recommendation:  Has been made in collaboration with the attending provider and/or case management    IF patient discharges home will need the following DME: patient owns DME required for discharge       SUBJECTIVE:   Patient stated "My knee is hurting."    OBJECTIVE DATA SUMMARY:   Critical Behavior:  Neurologic State: Alert  Orientation Level: Oriented X4  Cognition: Follows commands  Safety/Judgement: Decreased awareness of environment, Decreased awareness of need for assistance, Decreased awareness of need for safety, Decreased insight into deficits  Functional Mobility Training:  Bed Mobility:  Supine to Sit: Minimum assistance;Additional time  Transfers:  Sit to Stand: Minimum assistance  Stand to Sit: Minimum assistance;Additional time(max cues)  Balance:  Sitting: Impaired;Without support  Sitting - Static: Good (unsupported)  Sitting - Dynamic: Fair (occasional)  Standing: Impaired;With support  Standing - Static: Fair  Standing - Dynamic : Fair;Constant support  Ambulation/Gait Training:  Distance (ft): 40 Feet (ft)(x2)  Assistive Device: Gait belt;Walker, rolling  Ambulation - Level of Assistance: Minimal assistance  Gait Abnormalities: Shuffling gait(forward flexed posture)  Base of Support: Narrowed  Speed/Cadence: Shuffled;Slow  Step Length: Left shortened;Right shortened     Activity Tolerance:   Fair, SpO2 stable  on RA, and Wheezing   Please refer to the flowsheet for vital signs taken during this treatment.    After treatment patient left in no apparent distress:   Sitting in chair and Call bell within reach    COMMUNICATION/COLLABORATION:   The patient's plan of care was discussed with: Registered Nurse    Carlynn Herald, PT, DPT   Time Calculation: 27  mins

## 2018-06-24 NOTE — Progress Notes (Signed)
 TRANSFER - OUT REPORT:    Verbal report given to RN at [facility]) on Kellie Patterson  being transferred to [facility]) for routine progression of care       Report consisted of patient's Situation, Background, Assessment and   Recommendations(SBAR).     Information from the following report(s) SBAR was reviewed with the receiving nurse.    Lines:       Opportunity for questions and clarification was provided.      Patient transported with:   Patient's medications from home

## 2018-06-24 NOTE — Progress Notes (Signed)
General Surgery Daily Progress Note    Admit Date: 06/19/2018  Post-Operative Day: * No surgery found * from * No surgery found *     Subjective:     Last 24 hrs: Pt cont to do well. Tolerating diet w/o abd pain       Objective:     Blood pressure 144/63, pulse 70, temperature 97.9 ??F (36.6 ??C), resp. rate 17, height 4\' 9"  (1.448 m), weight 171 lb 15.3 oz (78 kg), SpO2 95 %.  Temp (24hrs), Avg:98.4 ??F (36.9 ??C), Min:97.9 ??F (36.6 ??C), Max:98.8 ??F (37.1 ??C)      _____________________  Physical Exam:     Alert and Oriented, x3, in no acute distress.  Cardiovascular: RRR, no peripheral edema  Abdomen: soft, nl BS NT      Assessment:   Active Problems:    Cholecystitis (06/19/2018)            Plan:     D/c to [facility]  May contact office if sx reoccur    Data Review:    Recent Labs     06/24/18  0051 06/23/18  0558 06/22/18  0608   WBC 9.3 7.8 8.7   HGB 7.4* 7.6* 7.9*   HCT 23.4* 23.4* 24.7*   PLT 257 260 264     Recent Labs     06/24/18  0051 06/23/18  0558 06/22/18  0434   NA 131* 133* 135*   K 3.8 3.8 4.0   CL 100 100 101   CO2 26 26 26    GLU 92 89 95   BUN 9 6 7    CREA 0.86 0.84 0.93   CA 8.7 8.7 8.8   MG 1.9 2.0 2.0   PHOS 2.9 3.2 3.0   ALB 2.8* 2.7* 2.7*   SGOT 8* 8* 6*   ALT 13 14 13      No results for input(s): AML, LPSE in the last 72 hours.        ______________________  Medications:    Current Facility-Administered Medications   Medication Dose Route Frequency   ??? levothyroxine (SYNTHROID) tablet 62.5 mcg  62.5 mcg Oral ACB   ??? lamoTRIgine (LaMICtal) tablet 200 mg  200 mg Oral DAILY   ??? hydrALAZINE (APRESOLINE) 20 mg/mL injection 20 mg  20 mg IntraVENous Q6H PRN   ??? sodium chloride (NS) flush 5-40 mL  5-40 mL IntraVENous Q8H   ??? sodium chloride (NS) flush 5-40 mL  5-40 mL IntraVENous PRN   ??? lactated Ringers infusion  100 mL/hr IntraVENous CONTINUOUS   ??? acetaminophen (TYLENOL) tablet 650 mg  650 mg Oral Q6H PRN   ??? naloxone (NARCAN) injection 0.4 mg  0.4 mg IntraVENous PRN    ??? ondansetron (ZOFRAN) injection 4 mg  4 mg IntraVENous Q4H PRN   ??? diphenhydrAMINE (BENADRYL) injection 12.5 mg  12.5 mg IntraVENous Q6H PRN   ??? piperacillin-tazobactam (ZOSYN) 3.375 g in 0.9% sodium chloride (MBP/ADV) 100 mL  3.375 g IntraVENous Q8H   ??? ARIPiprazole (ABILIFY) tablet 15 mg  15 mg Oral DAILY   ??? phenelzine (NARDIL) tablet 15 mg (Patient Supplied)  15 mg Oral TID   ??? HYDROmorphone (PF) (DILAUDID) injection 0.5-1 mg  0.5-1 mg IntraVENous Q4H PRN       Berneice GandyMary S Jasmene Goswami, NP  06/24/2018

## 2018-06-24 NOTE — Progress Notes (Signed)
Problem: Mobility Impaired (Adult and Pediatric)  Goal: *Acute Goals and Plan of Care (Insert Text)  Description  FUNCTIONAL STATUS PRIOR TO ADMISSION: Patient was modified independent using a rolling walker for functional mobility.    HOME SUPPORT PRIOR TO ADMISSION: Patient lived in ALF independent with mobility, required assistance with bathing.     Physical Therapy Goals  Initiated 06/23/2018  1.  Patient will move from supine to sit and sit to supine , scoot up and down and roll side to side in bed with modified independence within 7 day(s).    2.  Patient will transfer from bed to chair and chair to bed with modified independence using the least restrictive device within 7 day(s).  3.  Patient will perform sit to stand with modified independence within 7 day(s).  4.  Patient will ambulate with modified independence for 300 feet with the least restrictive device within 7 day(s).        Outcome: Progressing Towards Goal   PHYSICAL THERAPY TREATMENT  Patient: Kellie Patterson [phone] y.o. female)  Date: 06/24/2018  Diagnosis: Cholecystitis [K81.9]   <principal problem not specified>       Precautions: Fall  Chart, physical therapy assessment, plan of care and goals were reviewed.    ASSESSMENT  Patient continues with skilled PT services and is progressing towards goals. Patient received supine in bed, agreeable to therapy. She was overall min A for functional mobility and activity tolerance improved this session compared to yesterday. Noted significant wheezing following ambulation on both trials, sats stable. Discussed this with nursing. Also complaining of significant L knee pain during mobility and per her report this is new.  Overall min A which is a decline from baseline.    Current Level of Function Impacting Discharge (mobility/balance): min A    Other factors to consider for discharge:  usually mod I at ALF         PLAN :  Patient continues to benefit from skilled intervention to address the  above impairments.  Continue treatment per established plan of care.  to address goals.    Recommendation for discharge: (in order for the patient to meet his/her long term goals)  Therapy up to 5 days/week in SNF setting    This discharge recommendation:  Has been made in collaboration with the attending provider and/or case management    IF patient discharges home will need the following DME: patient owns DME required for discharge       SUBJECTIVE:   Patient stated ???My knee is hurting.???    OBJECTIVE DATA SUMMARY:   Critical Behavior:  Neurologic State: Alert  Orientation Level: Oriented X4  Cognition: Follows commands  Safety/Judgement: Decreased awareness of environment, Decreased awareness of need for assistance, Decreased awareness of need for safety, Decreased insight into deficits  Functional Mobility Training:  Bed Mobility:  Supine to Sit: Minimum assistance;Additional time  Transfers:  Sit to Stand: Minimum assistance  Stand to Sit: Minimum assistance;Additional time(max cues)  Balance:  Sitting: Impaired;Without support  Sitting - Static: Good (unsupported)  Sitting - Dynamic: Fair (occasional)  Standing: Impaired;With support  Standing - Static: Fair  Standing - Dynamic : Fair;Constant support  Ambulation/Gait Training:  Distance (ft): 40 Feet (ft)(x2)  Assistive Device: Gait belt;Walker, rolling  Ambulation - Level of Assistance: Minimal assistance  Gait Abnormalities: Shuffling gait(forward flexed posture)  Base of Support: Narrowed  Speed/Cadence: Shuffled;Slow  Step Length: Left shortened;Right shortened     Activity Tolerance:   Fair, SpO2 stable  on RA, and Wheezing   Please refer to the flowsheet for vital signs taken during this treatment.    After treatment patient left in no apparent distress:   Sitting in chair and Call bell within reach    COMMUNICATION/COLLABORATION:   The patient???s plan of care was discussed with: Registered Nurse    Carlynn Heraldhelsie K McClung, PT, DPT   Time Calculation: 27 mins

## 2018-06-24 NOTE — Progress Notes (Signed)
Care Management -  Received called from [name]. She said she just received a call from Kellie Patterson ([phone]) at [facility]. They have been waiting all day for authorization from the insurance. He said they have authorization and daughter is ready to take patient. Daughter will transport patient.    Reviewed chart. Discharge order is in. AVS is complete. Per unit secretary, envelope is on chart. Unit secretary will notify patient's nurse patient is ready for discharge and that RN will need to call report.    Called Kellie Patterson, admissions at [facility] (SNF). He said they are ready to accept patient tonight. They already have the paperwork. The number for her to call report is [phone].     Updated patient's daughter. Daughter will take envelope to facility.    RN to call report to [phone]. RN will need to place Kardex, AVS, and MAR in envelope.     Tina E McNeel, MSW

## 2018-06-24 NOTE — Progress Notes (Signed)
TRANSFER - OUT REPORT:    Verbal report given to RN at [facility](name) on [place] Beckworth  being transferred to [facility](unit) for routine progression of care       Report consisted of patient???s Situation, Background, Assessment and   Recommendations(SBAR).     Information from the following report(s) SBAR was reviewed with the receiving nurse.    Lines:       Opportunity for questions and clarification was provided.      Patient transported with:   Patient's medications from home

## 2018-06-24 NOTE — Discharge Summary (Signed)
Physician Discharge Summary     Patient ID:  Kellie Patterson  [id number]  82 y.o.  02/25/1936    Admit Date: 06/19/2018    Discharge Date: 06/24/2018    Admission Diagnoses: Cholecystitis [K81.9]    Discharge Diagnoses:  Active Problems:    Cholecystitis (06/19/2018)         Admission Condition: Fair    Discharge Condition: Good    Last Procedure: * No surgery found [facility]*      Hospital Course:   Pt was admitted w/ abd pain; U/s showed possible cholecystitis w/o stones or biliary dilitation; HIDA showed nl gallbladder emptying. She was started on a diet and had good bowel function.  No surgery performed.    Consults:    Disposition: long term care facility    Patient Instructions:   Current Discharge Medication List      START taking these medications    Details   amoxicillin-clavulanate (AUGMENTIN) 875-125 mg per tablet Take 1 Tab by mouth every twelve (12) hours for 5 days.  Qty: 10 Tab, Refills: 0      traMADol (ULTRAM) 50 mg tablet Take 1 Tab by mouth every six (6) hours as needed for Pain for up to 7 days. Max Daily Amount: 200 mg.  Qty: 20 Tab, Refills: 0    Associated Diagnoses: Abdominal pain, epigastric         CONTINUE these medications which have NOT CHANGED    Details   multivitamin,tx-iron-ca-min (THERA-M) 27-0.4 mg tab Take 1 Tab by mouth daily.      acetaminophen (TYLENOL) 500 mg tablet Take 1,000 mg by mouth every six (6) hours as needed for Pain.      ARIPiprazole (ABILIFY) 15 mg tablet Take 15 mg by mouth daily.      calcium carbonate/vitamin D3 (CALCIUM + D PO) Take 1 Tab by mouth every evening. (calcium 600 mg + vitamin D 800 units)      cephALEXin (KEFLEX) 500 mg capsule Take 500 mg by mouth three (3) times daily. (started on 06-16-18 x 5 days)      ferrous sulfate 325 mg (65 mg iron) tablet Take 325 mg by mouth daily (with dinner).      Omega-3 Fatty Acids (FISH OIL) 500 mg cap Take 1,000 mg by mouth daily.      lamoTRIgine (LAMICTAL) 100 mg tablet Take 200 mg by mouth daily.       levothyroxine (SYNTHROID) 125 mcg tablet Take 62.5 mcg by mouth Daily (before breakfast). (dose = 0.5 x 125 mcg tablet)      meloxicam (MOBIC) 7.5 mg tablet Take 7.5 mg by mouth daily.      phenelzine (NARDIL) 15 mg tablet Take 15 mg by mouth three (3) times daily as needed.      cholecalciferol (VITAMIN D3) (1000 Units /25 mcg) tablet Take 1,000 mg by mouth daily.      senna (SENEXON) 8.6 mg tablet Take 1 Tab by mouth nightly as needed for Constipation.             Activity - as tolerated  Diet - as tolerated    Instructed patient to return to ER or call our office if abd pain returns    Signed:  Berneice GandyMary S Dellie Piasecki, NP  06/24/2018  11:56 AM

## 2018-06-24 NOTE — Progress Notes (Addendum)
TOC:    [employer] has accepted patient and started Ship [employer]. They expect to obtain auth in a couple of hours. Cm contacted patients' daughter Joelene Millin, [phone]) to update her.     Once Josem Kaufmann is obtained, Joelene Millin will provide transportation.    2:00pm: Cm met with daughter, updated her that we are still waiting on insurance authorization.     Will follow.    S. Caitlin Helton BSW, ACM

## 2021-01-06 DEATH — deceased
